# Patient Record
Sex: Female | Born: 1935 | Race: White | Hispanic: Refuse to answer | Marital: Married | Smoking: Never smoker
Health system: Southern US, Community
[De-identification: ages and names within clinical notes are randomized; demographics above are authoritative.]

## PROBLEM LIST (undated history)

## (undated) DIAGNOSIS — M1990 Unspecified osteoarthritis, unspecified site: Secondary | ICD-10-CM

## (undated) DIAGNOSIS — I10 Essential (primary) hypertension: Secondary | ICD-10-CM

## (undated) DIAGNOSIS — E039 Hypothyroidism, unspecified: Secondary | ICD-10-CM

## (undated) DIAGNOSIS — R000 Tachycardia, unspecified: Secondary | ICD-10-CM

## (undated) HISTORY — PX: TONSILLECTOMY: SUR1361

## (undated) HISTORY — PX: JOINT REPLACEMENT: SHX530

## (undated) HISTORY — PX: APPENDECTOMY: SHX54

## (undated) HISTORY — PX: CERVICAL SPINE SURGERY: SHX589

---

## 1992-08-30 HISTORY — PX: TOTAL HIP ARTHROPLASTY: SHX124

## 2002-08-30 HISTORY — PX: TOTAL HIP ARTHROPLASTY: SHX124

## 2002-08-30 HISTORY — PX: VITRECTOMY: SHX106

## 2002-08-30 HISTORY — PX: CATARACT EXTRACTION W/ INTRAOCULAR LENS IMPLANT: SHX1309

## 2008-08-30 HISTORY — PX: TOTAL KNEE ARTHROPLASTY: SHX125

## 2012-08-30 HISTORY — PX: TOTAL SHOULDER ARTHROPLASTY: SHX126

## 2019-09-25 ENCOUNTER — Ambulatory Visit: Payer: Self-pay

## 2019-10-04 ENCOUNTER — Ambulatory Visit: Payer: Medicare Other | Attending: Internal Medicine

## 2019-10-04 DIAGNOSIS — Z23 Encounter for immunization: Secondary | ICD-10-CM

## 2019-10-04 NOTE — [place] Notes (Signed)
   Covid-19 Vaccination Clinic  Name:  Christine Mcintyre    MRN: [id number] DOB: 06/24/1935  10/04/2019  Christine Mcintyre was observed post Covid-19 immunization for 15 minutes without incidence. She was provided with Vaccine Information Sheet and instruction to access the V-Safe system.   Christine Mcintyre was instructed to call 911 with any severe reactions post vaccine: Marland Kitchen Difficulty breathing  . Swelling of your face and throat  . A fast heartbeat  . A bad rash all over your body  . Dizziness and weakness    Immunizations Administered    Name Date Dose VIS Date Route   Pfizer COVID-19 Vaccine 10/04/2019  4:45 PM 0.3 mL 08/10/2019 Intramuscular   Manufacturer: [place]   Lot: [id number]   [place]: [id number]

## 2019-10-16 ENCOUNTER — Ambulatory Visit: Payer: Self-pay

## 2019-10-29 ENCOUNTER — Ambulatory Visit: Payer: Medicare Other | Attending: Internal Medicine

## 2019-10-29 DIAGNOSIS — Z23 Encounter for immunization: Secondary | ICD-10-CM

## 2019-10-29 NOTE — [place] Notes (Signed)
   Covid-19 Vaccination Clinic  Name:  Maimuna Schlegelmilch    MRN: [id number] DOB: 10-10-1934  10/29/2019  Ms. Houlahan was observed post Covid-19 immunization for 15 minutes without incidence. She was provided with Vaccine Information Sheet and instruction to access the V-Safe system.   Ms. Adamek was instructed to call 911 with any severe reactions post vaccine: Marland Kitchen Difficulty breathing  . Swelling of your face and throat  . A fast heartbeat  . A bad rash all over your body  . Dizziness and weakness    Immunizations Administered    Name Date Dose VIS Date Route   Pfizer COVID-19 Vaccine 10/29/2019  2:15 PM 0.3 mL 08/10/2019 Intramuscular   Manufacturer: [place]   Lot: [id number]   [place]: [id number]

## 2019-10-30 ENCOUNTER — Other Ambulatory Visit: Payer: Self-pay | Admitting: Neurosurgery

## 2019-11-05 NOTE — H&P (Signed)
Patient ID:   [id number] Patient: Christine Mcintyre  Date of Birth: 1934-10-04 Visit Type: Office Visit   Date: 09/12/2019 12:15 PM Provider: Marchia Meiers. Vertell Limber MD   This 85 year old female presents for MRI/bone density review.  HISTORY OF PRESENT ILLNESS:  1.  MRI/bone density review  Patient returns to review her bone density results and MRI.  08/13/2019 normal bone density MRI 08/08/2019 on canopy  Patient reports increased frequency and bowel and bladder urge/stress incontinence  Tylenol is taken t.i.d.  (unable to take other pain medication due to blood pressure issues)  The patient has been having problems with bowel and bladder continence and describes urinary and fecal accidents.  This is highly significant on review of the patient's MRI which shows marked progression of degeneration and stenosis at the L4-5 level with critical stenosis at this level.  The thecal sac is barely recognizable at this level and is severely compressed.  There is also moderately severe compression at the L3-4 level.  The patient has scoliosis and spondylolisthesis of L4 and L5.  She currently grades her pain as 8/10 in severity.  Based on these imaging findings and the patient's complaints consistent with a cauda equinus syndrome, I have recommended proceeding with surgical intervention.  We are in a markedly reduced availability of surgery due to the COVID-19 pandemic and our cancelling elective surgeries.  It is my belief that this patient's complaints are worrisome and that she needs to go through surgery sooner rather than later and I therefore am recommending proceeding with surgical intervention.  I do think that the patient needs to have established medical care and a medical clearance prior to surgery         Medical/Surgical/Interim History Reviewed, no change.  Last detailed document date:08/01/2019.     PAST MEDICAL HISTORY, SURGICAL HISTORY, FAMILY HISTORY, SOCIAL HISTORY AND  REVIEW OF SYSTEMS I have reviewed the patient's past medical, surgical, family and social history as well as the comprehensive review of systems as included on the [facility] history form dated 06/18/2019, which I have signed.  Family History:  Reviewed, no changes.  Last detailed document date:08/01/2019.   Social History: Reviewed, no changes. Last detailed document date: 08/01/2019.    MEDICATIONS: (added, continued or stopped this visit) Started Medication Directions Instruction Stopped   amlodipine      calcium 1000 ORAL TABLET      dorzolamide      levothyroxine      losartan 100 mg tablet      magnesium      metoprolol succinate ER 100 mg tablet,extended release 24 hr      timolol maleate        ALLERGIES: Ingredient Reaction Medication Name Comment  TAFLUPROST      Reviewed, no changes.    PHYSICAL EXAM:   Vitals Date Temp F BP Pulse Ht In Wt Lb BMI BSA Pain Score  09/12/2019 97.7 175/87 60 61.5 128 23.79  8/10      IMPRESSION:   Marked spinal stenosis with cauda equina syndrome L4-5 more affected than the L3-4 levels.  PLAN:  I have recommended medical clearance and surgical intervention which consisted L3-4 and L4-5 decompression with fusion without interbody grafting.  We will go ahead with surgery when she is cleared due to the complaints of progressive bowel and bladder dysfunction and the imaging findings which show marked cauda equina compression at the L4-5 level  Orders: Diagnostic Procedures: Assessment Procedure  M54.16  Lumbar Spine- AP/Lat  Instruction(s)/Education: Assessment Instruction  I10 Lifestyle education  Miscellaneous: Assessment   M43.16 LSO Brace   Completed Orders (this encounter) Order Details Reason Side Interpretation Result Initial Treatment Date Region  Lifestyle education Patient will follow up with Primary Care Physician.         Assessment/Plan   # Detail Type Description   1.  Assessment Cauda equina compression (G83.4).       2. Assessment Spondylolisthesis, lumbar region (M43.16).   Plan Orders Family Practice Referral. Clinical information/comments: Patient has recently relocated to this area and needs primary care physician.  She also needs clearance for surgery. [facility]. LSO Brace. Clinical information/comments: Script given to patient.       3. Assessment Osteopenia determined by x-ray (M85.80).       4. Assessment Degenerative lumbar spinal stenosis (M48.061).       5. Assessment Scoliosis (and kyphoscoliosis), idiopathic (M41.20).       6. Assessment Lumbar radiculopathy (M54.16).       7. Assessment Essential (primary) hypertension (I10).         Pain Management Plan Pain Scale: 8/10. Method: Numeric Pain Intensity Scale. Location: back. Onset: 08/01/2019. Duration: varies. Quality: discomforting. Pain management follow-up plan of care: Patient will continue medication management..              Provider:  Marchia Meiers. Vertell Limber MD  09/15/2019 04:00 PM    Dictation edited by: Marchia Meiers. Vertell Limber    CC Providers: Erline Levine MD  [address] [place], [place] [address]               Electronically signed by Marchia Meiers. Vertell Limber MD on 09/15/2019 04:00 PM  Patient ID:   [id number] Patient: Christine Mcintyre  Date of Birth: [birth year] Visit Type: Office Visit   Date: 08/01/2019 09:15 AM Provider: Marchia Meiers. Vertell Limber MD   This 85 year old female presents for Back pain.  HISTORY OF PRESENT ILLNESS:  1.  Back pain  Christine Mcintyre, 85 year old retired female, visits for evaluation of low back, right leg pain with buttock and perineal numbness/tingling.  Patient reports onset of low back pain following work out in October of 2019. Symptoms have been treated conservatively in [place] prior to her move here 2 months ago.  Over the last 3 months, she has noticed stress and urge bowel incontinence coinciding with "waves" of  buttock and perineal numbness.  Physical therapy offered no lasting relief ESI x3 offered no relief  Tylenol taken only as needed  History:  HTN  Surgical history:  Left THR 1994, right THR 2000, right TKR 2010, right shoulder replacement 2014, bilateral carpal tunnel release 2019, C5-6 ACDF/50% corpectomies C5-6  07/13/2018 L-spine MRI uploaded canopy X-ray on canopy   the patient complains of pain at the base of her spine to her pelvic area and tingling into both of her legs which she say get weak.  She notes pain in the coccyx.  She says her right leg pain is better.  She says most of the pain goes to her hamstring.  She feels that things have worsened recently.    I reviewed lumbar  MRI scan from 07/13/2018 which shows L3-4 and L4-5 stenosis with facet arthropathy and scoliosis.    Plain radiographs demonstrate deck stroke convex scoliosis of the lumbar spine with anterolisthesis of L4 and L5 of 6 mm on extension increasing to 9 mm on flexion and decreasing to 7 mm on neutral lateral radiograph.  Cervical  radiographs demonstrate prior anterior cervical decompression and fusion at the C5-6 level with anterior cervical plating without complicating features.    The patient describes that she is very frustrated with her inability to be active and wants to have something done if this is possible.          PAST MEDICAL/SURGICAL HISTORY:   (Detailed)    Disease/disorder Onset Date Management Date Comments    Carpal tunnel release 2019     Knee replacement 2010     Hip replacement 2000     Reverse shoulder replacement      Cervial Corpectomy C5-6 with Fusion    Hypertension         PAST MEDICAL HISTORY, SURGICAL HISTORY, FAMILY HISTORY, SOCIAL HISTORY AND REVIEW OF SYSTEMS I have reviewed the patient's past medical, surgical, family and social history as well as the comprehensive review of systems as included on the [facility] history form dated  06/18/2019, which I have signed.  Family History:  (Detailed) Relationship Family Member Name Deceased Age at Death Condition Onset Age Cause of Death      Family history of Systemic Lupus  N     Social History:  (Detailed) Tobacco use reviewed. Preferred language is [place].   Tobacco use status: Current non-smoker. Smoking status: Former smoker.  SMOKING STATUS Type Smoking Status Usage Per Day Years Used Total Pack Years  Cigarette Former smoker      TOBACCO CESSATION INFORMATION Date Counseled By Order Status Description Code Tobacco Cessation Information  07/23/2019 [place] Tobacco cessation counseling completed   Smoking cessation education       MEDICATIONS: (added, continued or stopped this visit) Started Medication Directions Instruction Stopped   amlodipine      calcium 1000 ORAL TABLET      dorzolamide      levothyroxine      losartan 100 mg tablet      magnesium      metoprolol succinate ER 100 mg tablet,extended release 24 hr      timolol maleate        ALLERGIES: Ingredient Reaction Medication Name Comment  TAFLUPROST      Reviewed, updated.   REVIEW OF SYSTEMS   See scanned patient registration form, dated 06/18/2019, signed and dated on 08/01/2019  Review of Systems Details System Neg/Pos Details  Constitutional Negative Chills, Fatigue, Fever, Malaise, Night sweats, Weight gain and Weight loss.  ENMT Negative Ear drainage, Hearing loss, Nasal drainage, Otalgia, Sinus pressure and Sore throat.  Eyes Negative Eye discharge, Eye pain and Vision changes.  Respiratory Negative Chronic cough, Cough, Dyspnea, Known TB exposure and Wheezing.  Cardio Negative Chest pain, Claudication, Edema and Irregular heartbeat/palpitations.  GI Negative Abdominal pain, Blood in stool, Change in stool pattern, Constipation, Decreased appetite, Diarrhea, Heartburn, Nausea and Vomiting.  GU Negative Dysuria, Hematuria, Polyuria (Genitourinary), Urinary  frequency, Urinary incontinence and Urinary retention.  Endocrine Negative Cold intolerance, Heat intolerance, Polydipsia and Polyphagia.  Neuro Positive Numbness in extremity.  Psych Negative Anxiety, Depression and Insomnia.  Integumentary Negative Brittle hair, Brittle nails, Change in shape/size of mole(s), Hair loss, Hirsutism, Hives, Pruritus, Rash and Skin lesion.  MS Positive Back pain.  Hema/Lymph Negative Easy bleeding, Easy bruising and Lymphadenopathy.  Allergic/Immuno Negative Contact allergy, Environmental allergies, Food allergies and Seasonal allergies.  Reproductive Negative Breast discharge, Breast lumps, Dysmenorrhea, Dyspareunia, History of abnormal PAP smear, Hot flashes, Irregular menses and Vaginal discharge.   PHYSICAL EXAM:   Vitals Date Temp  F BP Pulse Ht In Wt Lb BMI BSA Pain Score  08/01/2019 96.9 190/85 64 61.5 127.8 23.76  5/10    PHYSICAL EXAM Details General Level of Distress: no acute distress Overall Appearance: normal  Head and Face  Right Left  Fundoscopic Exam:  normal normal    Cardiovascular Cardiac: regular rate and rhythm without murmur  Right Left  Carotid Pulses: normal normal  Respiratory Lungs: clear to auscultation  Neurological Orientation: normal Recent and Remote Memory: normal Attention Span and Concentration:   normal Language: normal Fund of Knowledge: normal  Right Left Sensation: normal normal Upper Extremity Coordination: normal normal  Lower Extremity Coordination: normal normal  Musculoskeletal Gait and Station: normal  Right Left Upper Extremity Muscle Strength: normal normal Lower Extremity Muscle Strength: normal normal Upper Extremity Muscle Tone:  normal normal Lower Extremity Muscle Tone: normal normal   Motor Strength Upper and lower extremity motor strength was tested in the clinically pertinent muscles.     Deep Tendon  Reflexes  Right Left Biceps: normal normal Triceps: normal normal Brachioradialis: normal normal Patellar: normal normal Achilles: normal normal  Sensory Sensation was tested at L1 to S1.   Cranial Nerves II. Optic Nerve/Visual Fields: normal III. Oculomotor: normal IV. Trochlear: normal V. Trigeminal: normal VI. Abducens: normal VII. Facial: normal VIII. Acoustic/Vestibular: normal IX. Glossopharyngeal: normal X. Vagus: normal XI. Spinal Accessory: normal XII. Hypoglossal: normal  Motor and other Tests Lhermittes: negative Rhomberg: negative Pronator drift: absent     Right Left Hoffman's: normal normal Clonus: normal normal Babinski: normal normal SLR: negative negative Patrick's Corky Sox): negative negative Toe Walk: normal normal Toe Lift: normal normal Heel Walk: normal normal SI Joint: nontender nontender   Additional Findings:   patient is able to bend within 4 in of the floor with her upper extremities outstretched.    IMPRESSION:     Lumbar spinal stenosis with scoliosis and severe pain with  mobile spondylolisthesis of L4 on L5  PLAN:   follow-up after lumbar MRI scan with bone density testing  Orders: Office Procedures/Services: Assessment Service Comments   Bone density    Diagnostic Procedures: Assessment Procedure  M41.20 Scoliosis- AP/Lat  M43.16 MRI Spine/lumb W/o Contrast  M54.16 Lumbar Spine- AP/Lat/Flex/Ex  Instruction(s)/Education: Assessment Instruction  I10 Lifestyle education   Completed Orders (this encounter) Order Details Reason Side Interpretation Result Initial Treatment Date Region  Lumbar Spine- AP/Lat/Flex/Ex 1 of 2     08/01/2019 All Levels to All Levels  Scoliosis- AP/Lat 2 of 2     08/01/2019 All Levels to All Levels  Lifestyle education Patient will follow up with primary care physician.         Assessment/Plan   # Detail Type Description   1. Assessment Idiopathic scoliosis of lumbar region (M41.26).        2. Assessment Low back pain, unspecified back pain laterality, with sciatica presence unspecified (M54.5).       3. Assessment Spondylolisthesis, lumbar region (M43.16).       4. Assessment Degenerative lumbar spinal stenosis (M48.061).       5. Assessment Lumbar radiculopathy (M54.16).       6. Assessment Scoliosis (and kyphoscoliosis), idiopathic (M41.20).       7. Assessment Essential (primary) hypertension (I10).       8. Other Orders Orders not associated to today's assessments.   Plan Orders Bone density .     Pain Management Plan Pain Scale: 5/10. Method: Numeric Pain Intensity Scale. Onset: 08/01/2019. Duration: varies. Quality: discomforting.  Pain management follow-up plan of care: Patient will continue medication management.              Provider:  Marchia Meiers. Vertell Limber MD  08/02/2019 12:32 PM    Dictation edited by: Marchia Meiers. Vertell Limber    CC Providers: Erline Levine MD  [address] [place], [place] [address]               Electronically signed by Marchia Meiers. Vertell Limber MD on 08/02/2019 12:32 PM

## 2019-11-29 NOTE — [place] Notes (Signed)
[facility] [place], [place] AT [facility] OF [address] [place] [address] Phone: [phone] Fax: [phone]    Your procedure is scheduled on Tuesday, April 6th.  Report to [facility] Main Entrance "A" at 9:40 A.M., and check in at the Admitting office.  Call this number if you have problems the morning of surgery:  [phone]  Call [phone] if you have any questions prior to your surgery date Monday-Friday 8am-4pm   Remember:  Do not eat or drink after midnight the night before your surgery    Take these medicines the morning of surgery with A SIP OF WATER  brimonidine (ALPHAGAN)/eye drops dorzolamide (TRUSOPT)/eye drops levothyroxine (SYNTHROID) metoprolol succinate (TOPROL-XL) timolol (TIMOPTIC)/eye drops  If needed - acetaminophen (TYLENOL)    As of today, STOP taking any Aspirin (unless otherwise instructed by your surgeon) and Aspirin containing products, Aleve, Naproxen, Ibuprofen, Motrin, Advil, Goody's, BC's, all herbal medications, fish oil, and all vitamins.             Do not wear jewelry, make up, or nail polish            Do not wear lotions, powders, perfumes, or deodorant.            Do not shave 48 hours prior to surgery.  Men may shave face and neck.            Do not bring valuables to the hospital.            [facility] is not responsible for any belongings or valuables.  Do NOT Smoke (Tobacco/Vapping) or drink Alcohol 24 hours prior to your procedure If you use a CPAP at night, you may bring all equipment for your overnight stay.   Contacts, glasses, dentures or bridgework may not be worn into surgery.      For patients admitted to the hospital, discharge time will be determined by your treatment team.   Patients discharged the day of surgery will not be allowed to drive home, and someone needs to stay with them for 24 hours.  Special instructions:   [place]- Preparing For  Surgery  Before surgery, you can play an important role. Because skin is not sterile, your skin needs to be as free of germs as possible. You can reduce the number of germs on your skin by washing with CHG (chlorahexidine gluconate) Soap before surgery.  CHG is an antiseptic cleaner which kills germs and bonds with the skin to continue killing germs even after washing.    Oral Hygiene is also important to reduce your risk of infection.  Remember - BRUSH YOUR TEETH THE MORNING OF SURGERY WITH YOUR REGULAR TOOTHPASTE  Please do not use if you have an allergy to CHG or antibacterial soaps. If your skin becomes reddened/irritated stop using the CHG.  Do not shave (including legs and underarms) for at least 48 hours prior to first CHG shower. It is OK to shave your face.  Please follow these instructions carefully.   1. Shower the NIGHT BEFORE SURGERY and the MORNING OF SURGERY with CHG Soap.   2. If you chose to wash your hair, wash your hair first as usual with your normal shampoo.  3. After you shampoo, rinse your hair and body thoroughly to remove the shampoo.  4. Use CHG as you would any other liquid soap. You can apply CHG directly to the skin and wash  gently with a scrungie or a clean washcloth.   5. Apply the CHG Soap to your body ONLY FROM THE NECK DOWN.  Do not use on open wounds or open sores. Avoid contact with your eyes, ears, mouth and genitals (private parts). Wash Face and genitals (private parts)  with your normal soap.   6. Wash thoroughly, paying special attention to the area where your surgery will be performed.  7. Thoroughly rinse your body with warm water from the neck down.  8. DO NOT shower/wash with your normal soap after using and rinsing off the CHG Soap.  9. Pat yourself dry with a CLEAN TOWEL.  10. Wear CLEAN PAJAMAS to bed the night before surgery, wear comfortable clothes the morning of surgery  11. Place CLEAN SHEETS on your bed the night of your first  shower and DO NOT SLEEP WITH PETS.  Day of Surgery: Do not apply any deodorants/lotions.  Please wear clean clothes to the hospital/surgery center.   Remember to brush your teeth WITH YOUR REGULAR TOOTHPASTE.   Please read over the following fact sheets that you were given.

## 2019-11-30 ENCOUNTER — Encounter (HOSPITAL_COMMUNITY): Payer: Self-pay

## 2019-11-30 ENCOUNTER — Other Ambulatory Visit (HOSPITAL_COMMUNITY)
Admission: RE | Admit: 2019-11-30 | Discharge: 2019-11-30 | Disposition: A | Discharge status: Home / Self Care | Payer: Medicare Other | Source: Ambulatory Visit | Attending: Neurosurgery | Admitting: Neurosurgery

## 2019-11-30 ENCOUNTER — Other Ambulatory Visit: Payer: Self-pay

## 2019-11-30 ENCOUNTER — Encounter (HOSPITAL_COMMUNITY)
Admission: RE | Admit: 2019-11-30 | Discharge: 2019-11-30 | Disposition: A | Discharge status: Home / Self Care | Payer: Medicare Other | Source: Ambulatory Visit | Attending: Neurosurgery | Admitting: Neurosurgery

## 2019-11-30 DIAGNOSIS — Z20822 Contact with and (suspected) exposure to covid-19: Secondary | ICD-10-CM | POA: Insufficient documentation

## 2019-11-30 DIAGNOSIS — Z01812 Encounter for preprocedural laboratory examination: Secondary | ICD-10-CM | POA: Insufficient documentation

## 2019-11-30 HISTORY — DX: Essential (primary) hypertension: I10

## 2019-11-30 HISTORY — DX: Tachycardia, unspecified: R00.0

## 2019-11-30 HISTORY — DX: Unspecified osteoarthritis, unspecified site: M19.90

## 2019-11-30 HISTORY — DX: Hypothyroidism, unspecified: E03.9

## 2019-11-30 LAB — SURGICAL PCR SCREEN
MRSA, PCR: NEGATIVE
Staphylococcus aureus: NEGATIVE

## 2019-11-30 LAB — BASIC METABOLIC PANEL
Anion gap: 7 (ref 5–15)
BUN: 18 mg/dL (ref 8–23)
CO2: 28 mmol/L (ref 22–32)
Calcium: 9.4 mg/dL (ref 8.9–10.3)
Chloride: 106 mmol/L (ref 98–111)
Creatinine, Ser: 0.74 mg/dL (ref 0.44–1.00)
GFR calc Af Amer: >60 mL/min (ref >60)
GFR calc non Af Amer: >60 mL/min (ref >60)
Glucose, Bld: 92 mg/dL (ref 70–99)
Potassium: 4.1 mmol/L (ref 3.5–5.1)
Sodium: 141 mmol/L (ref 135–145)

## 2019-11-30 LAB — CBC
HCT: 47.9 % — ABNORMAL HIGH (ref 36.0–46.0)
Hemoglobin: 15.1 g/dL — ABNORMAL HIGH (ref 12.0–15.0)
MCH: 28 pg (ref 26.0–34.0)
MCHC: 31.5 g/dL (ref 30.0–36.0)
MCV: 88.7 fL (ref 80.0–100.0)
Platelets: 232 10*3/uL (ref 150–400)
RBC: 5.4 MIL/uL — ABNORMAL HIGH (ref 3.87–5.11)
RDW: 14.7 % (ref 11.5–15.5)
WBC: 5.1 10*3/uL (ref 4.0–10.5)
nRBC: 0 % (ref 0.0–0.2)

## 2019-11-30 LAB — SARS CORONAVIRUS 2 (TAT 6-24 HRS): SARS Coronavirus 2: NEGATIVE

## 2019-11-30 LAB — ABO/RH: ABO/RH(D): O POS

## 2019-11-30 LAB — TYPE AND SCREEN
ABO/RH(D): O POS
Antibody Screen: NEGATIVE

## 2019-11-30 NOTE — [place] Notes (Addendum)
PCP - Dr. Shon Baton - patient recently relocated Cardiologist - denies  PPM/ICD - denies   Chest x-ray - N/A EKG - per patient "had one last month at [facility]" - Tracing requested Stress Test 07/31/15 (C.E.) ECHO - 08/06/14  (C.[facility].) Cardiac Cath - denies  Sleep Study - denies CPAP - N/A  Blood Thinner Instructions: N/A Aspirin Instructions: N/A  ERAS Protcol - No PRE-SURGERY Ensure or G2-   COVID TEST- Scheduled for today 11/30/2019 after PAT appointment. Patient verbalized understanding of self-quarantine instruction, appointment time and place.  Anesthesia review: YES, cardiac history, medical clearance, EKG tracing requested.  Patient denies shortness of breath, fever, cough and chest pain at PAT appointment  All instructions explained to the patient, with a verbal understanding of the material. Patient agrees to go over the instructions while at home for a better understanding. Patient also instructed to self quarantine after being tested for COVID-19. The opportunity to ask questions was provided.

## 2019-12-03 ENCOUNTER — Encounter (HOSPITAL_COMMUNITY): Payer: Self-pay

## 2019-12-03 NOTE — Progress Notes (Signed)
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    VS: BP (!) 145/85   Pulse 61   Temp 36.7 C (Oral)   Resp 17   Ht 5' 1.5" (1.562 m)   Wt 56.5 kg   SpO2 99%   BMI 23.16 kg/m    PROVIDERS: - PCP is Shon Baton, MD   LABS: Labs reviewed: Acceptable for surgery. (all labs ordered are listed, but only abnormal results are displayed)  Labs Reviewed  CBC - Abnormal; Notable for the following components:      Result Value   RBC 5.40 (*)    Hemoglobin 15.1 (*)    HCT 47.9 (*)    All other components within normal limits  SURGICAL PCR SCREEN  BASIC METABOLIC PANEL  TYPE AND SCREEN  ABO/RH     EKG 10/25/19 (at Dr. [facility]ane [employer] office): sinus bradycardia (57 bom). LA enlargement   CV:  Echo 07/31/15 (at [facility] care everywhere):  CONCLUSIONS: - Exam indication: I35.1 Aortic valve insufficiency (Nonrheumatic) - Left ventricular systolic function is normal. EF = 60  5% (visual est.) Baseline left ventricular diastolic function is consistent with abnormal relaxation (stage 1). - The right ventricle is normal in size. Right ventricular systolic function is normal. - The left atrial cavity is mildly dilated. - Aortic valve sclerosis without stenosis. Mild AI. Compared with the prior CC stress echocardiographic exam performed on 08/06/2014, there has been no significant change.   Stress echo 08/06/14 (at [facility] care everywhere):  CONCLUSIONS: - Exam indication: Hypertension, PVC, palpitations, shortness of breath, abnormal   EKG - The exercise stress echo was negative for ischemia at 91 % of MPHR and good  workload achieved (7.0 METS). - PVC's noted throughout the study. - Normal heart rate recovery and chronotropic response index. - The left ventricle is normal in size. Left ventricular systolic function is normal. EF = 62  5% (2D biplane) - The right ventricle is normal in size. Right ventricular systolic function is  normal. - The left atrial cavity is mildly dilated. - Mild MAC. Mild MR. - Mild-moderate aortic valve sclerosis without stenosis. Mild-moderate AI. - No prior echocardiographic exam available for comparison.    Past Medical History:  Diagnosis Date  . Arthritis    11/30/2019: per patient "both hands and left knee"  . Hypertension   . Tachycardia    11/30/2019: diagnosed about 5-6 years ago, been fine since taking metoprolol" per patient    Past Surgical History:  Procedure Laterality Date  . APPENDECTOMY    . CATARACT EXTRACTION W/ INTRAOCULAR LENS IMPLANT Left 2004  . CERVICAL SPINE SURGERY     11/30/2019: per patient "about 1-2 years ago, fusion surgery c5-c6"  . JOINT REPLACEMENT    . TONSILLECTOMY    . TOTAL HIP ARTHROPLASTY Left 1994  . TOTAL HIP ARTHROPLASTY Right 2004  . TOTAL KNEE ARTHROPLASTY Right 2010  . TOTAL SHOULDER ARTHROPLASTY Right 2014  . VITRECTOMY Left 2004    MEDICATIONS: . acetaminophen (TYLENOL) 650 MG CR tablet  .  amLODipine (NORVASC) 2.5 MG tablet  . Ascorbic Acid (VITAMIN C) 1000 MG tablet  . Biotin 10000 MCG TABS  . brimonidine (ALPHAGAN) 0.2 % ophthalmic solution  . calcium carbonate (OS-CAL - DOSED IN MG OF ELEMENTAL CALCIUM) 1250 (500 Ca) MG tablet  . dorzolamide (TRUSOPT) 2 % ophthalmic solution  . levothyroxine (SYNTHROID) 88 MCG tablet  . losartan (COZAAR) 100 MG tablet  . magnesium oxide (MAG-OX) 400 MG tablet  . metoprolol succinate (TOPROL-XL) 100 MG 24 hr tablet  . timolol (TIMOPTIC) 0.5 % ophthalmic solution   No current  facility-administered medications for this encounter.    If no changes, I anticipate pt can proceed with surgery as scheduled.   Willeen Cass, FNP-BC [facility]/Anesthesiology Phone: [phone] 12/03/2019 12:16 PM

## 2019-12-03 NOTE — H&P (Signed)
Patient ID:   [id number] Patient: Christine Mcintyre  Date of Birth: 24-Mar-1935 Visit Type: Office Visit   Date: 09/12/2019 12:15 PM Provider: Marchia Meiers. Vertell Limber MD   This 85 year old female presents for MRI/bone density review.  HISTORY OF PRESENT ILLNESS: 1.  MRI/bone density review  Patient returns to review her bone density results and MRI.  08/13/2019 normal bone density MRI 08/08/2019 on canopy  Patient reports increased frequency and bowel and bladder urge/stress incontinence  Tylenol is taken t.i.d.  (unable to take other pain medication due to blood pressure issues)  The patient has been having problems with bowel and bladder continence and describes urinary and fecal accidents.  This is highly significant on review of the patient's MRI which shows marked progression of degeneration and stenosis at the L4-5 level with critical stenosis at this level.  The thecal sac is barely recognizable at this level and is severely compressed.  There is also moderately severe compression at the L3-4 level.  The patient has scoliosis and spondylolisthesis of L4 and L5.  She currently grades her pain as 8/10 in severity.  Based on these imaging findings and the patient's complaints consistent with a cauda equinus syndrome, I have recommended proceeding with surgical intervention.  We are in a markedly reduced availability of surgery due to the COVID-19 pandemic and our cancelling elective surgeries.  It is my belief that this patient's complaints are worrisome and that she needs to go through surgery sooner rather than later and I therefore am recommending proceeding with surgical intervention.  I do think that the patient needs to have established medical care and a medical clearance prior to surgery      Medical/Surgical/Interim History Reviewed, no change.  Last detailed document date:08/01/2019.     PAST MEDICAL HISTORY, SURGICAL HISTORY, FAMILY HISTORY, SOCIAL HISTORY AND REVIEW OF  SYSTEMS I have reviewed the patient's past medical, surgical, family and social history as well as the comprehensive review of systems as included on the [facility] history form dated 06/18/2019, which I have signed.  Family History: Reviewed, no changes.  Last detailed document date:08/01/2019.   Social History: Reviewed, no changes. Last detailed document date: 08/01/2019.    MEDICATIONS: (added, continued or stopped this visit) Started Medication Directions Instruction Stopped  amlodipine     calcium 1000 ORAL TABLET     dorzolamide     levothyroxine     losartan 100 mg tablet     magnesium     metoprolol succinate ER 100 mg tablet,extended release 24 hr     timolol maleate       ALLERGIES: Ingredient Reaction Medication Name Comment TAFLUPROST     Reviewed, no changes.    PHYSICAL EXAM:  Vitals Date Temp F BP Pulse Ht In Wt Lb BMI BSA Pain Score 09/12/2019 97.7 175/87 60 61.5 128 23.79  8/10     IMPRESSION:  Marked spinal stenosis with cauda equina syndrome L4-5 more affected than the L3-4 levels.  PLAN: I have recommended medical clearance and surgical intervention which consisted L3-4 and L4-5 decompression with fusion without interbody grafting.  We will go ahead with surgery when she is cleared due to the complaints of progressive bowel and bladder dysfunction and the imaging findings which show marked cauda equina compression at the L4-5 level  Orders: Diagnostic Procedures: Assessment Procedure M54.16 Lumbar Spine- AP/Lat Instruction(s)/Education: Assessment Instruction I10 Lifestyle education Miscellaneous: Assessment  M43.16 LSO Brace  Completed Orders (this encounter) Order Details  Reason Side Interpretation Result Initial Treatment Date Region Lifestyle education Patient will follow up with Primary Care  Physician.        Assessment/Plan  # Detail Type Description  1. Assessment Cauda equina compression (G83.4).     2. Assessment Spondylolisthesis, lumbar region (M43.16).  Plan Orders Family Practice Referral. Clinical information/comments: Patient has recently relocated to this area and needs primary care physician.  She also needs clearance for surgery. [facility]. LSO Brace. Clinical information/comments: Script given to patient.     3. Assessment Osteopenia determined by x-ray (M85.80).     4. Assessment Degenerative lumbar spinal stenosis (M48.061).     5. Assessment Scoliosis (and kyphoscoliosis), idiopathic (M41.20).     6. Assessment Lumbar radiculopathy (M54.16).     7. Assessment Essential (primary) hypertension (I10).       Pain Management Plan Pain Scale: 8/10. Method: Numeric Pain Intensity Scale. Location: back. Onset: 08/01/2019. Duration: varies. Quality: discomforting. Pain management follow-up plan of care: Patient will continue medication management..              Provider:  Marchia Meiers. Vertell Limber MD  09/15/2019 04:00 PM    Dictation edited by: Marchia Meiers. Vertell Limber    CC Providers: Erline Levine MD  [address] [place], [place] [address]               Electronically signed by Marchia Meiers. Vertell Limber MD on 09/15/2019 04:00 PM

## 2019-12-03 NOTE — Anesthesia Preprocedure Evaluation (Addendum)
Anesthesia Evaluation  Patient identified by MRN, date of birth, ID band Patient awake    Reviewed: Allergy & Precautions, H&P , NPO status , Patient's Chart, lab work & pertinent test results, reviewed documented beta blocker date and time   Airway Mallampati: II  TM Distance: >3 FB Neck ROM: Full    Dental no notable dental hx. (+) Teeth Intact, Dental Advisory Given   Pulmonary neg pulmonary ROS,    Pulmonary exam normal breath sounds clear to auscultation       Cardiovascular hypertension, Pt. on medications and Pt. on home beta blockers  Rhythm:Regular Rate:Normal     Neuro/Psych negative neurological ROS  negative psych ROS   GI/Hepatic negative GI ROS, Neg liver ROS,   Endo/Other  Hypothyroidism   Renal/GU negative Renal ROS  negative genitourinary   Musculoskeletal  (+) Arthritis , Osteoarthritis,    Abdominal   Peds  Hematology negative hematology ROS (+)   Anesthesia Other Findings   Reproductive/Obstetrics negative OB ROS                          Anesthesia Physical Anesthesia Plan  ASA: II  Anesthesia Plan: General   Post-op Pain Management:    Induction: Intravenous  PONV Risk Score and Plan: 4 or greater and Ondansetron, Dexamethasone and Treatment may vary due to age or medical condition  Airway Management Planned: Oral ETT  Additional Equipment:   Intra-op Plan:   Post-operative Plan: Extubation in OR  Informed Consent: I have reviewed the patients History and Physical, chart, labs and discussed the procedure including the risks, benefits and alternatives for the proposed anesthesia with the patient or authorized representative who has indicated his/her understanding and acceptance.     Dental advisory given  Plan Discussed with: CRNA  Anesthesia Plan Comments: (See APP note by Durel Salts, FNP)       Anesthesia Quick Evaluation

## 2019-12-04 ENCOUNTER — Other Ambulatory Visit: Payer: Self-pay

## 2019-12-04 ENCOUNTER — Observation Stay (HOSPITAL_COMMUNITY)
Admission: RE | Admit: 2019-12-04 | Discharge: 2019-12-05 | Disposition: A | Discharge status: Home / Self Care | Payer: Medicare Other | Attending: Neurosurgery | Admitting: Neurosurgery

## 2019-12-04 ENCOUNTER — Inpatient Hospital Stay (HOSPITAL_COMMUNITY): Payer: Medicare Other | Admitting: Certified Registered Nurse Anesthetist

## 2019-12-04 ENCOUNTER — Encounter (HOSPITAL_COMMUNITY)
Admission: RE | Disposition: A | Discharge status: Home / Self Care | Payer: Self-pay | Source: Home / Self Care | Attending: Neurosurgery

## 2019-12-04 ENCOUNTER — Encounter (HOSPITAL_COMMUNITY): Payer: Self-pay | Admitting: Neurosurgery

## 2019-12-04 ENCOUNTER — Inpatient Hospital Stay (HOSPITAL_COMMUNITY): Payer: Medicare Other

## 2019-12-04 ENCOUNTER — Inpatient Hospital Stay (HOSPITAL_COMMUNITY): Payer: Medicare Other | Admitting: Physician Assistant

## 2019-12-04 DIAGNOSIS — M8580 Other specified disorders of bone density and structure, unspecified site: Secondary | ICD-10-CM | POA: Insufficient documentation

## 2019-12-04 DIAGNOSIS — Z87891 Personal history of nicotine dependence: Secondary | ICD-10-CM | POA: Insufficient documentation

## 2019-12-04 DIAGNOSIS — Z79890 Hormone replacement therapy (postmenopausal): Secondary | ICD-10-CM | POA: Insufficient documentation

## 2019-12-04 DIAGNOSIS — Z96611 Presence of right artificial shoulder joint: Secondary | ICD-10-CM | POA: Diagnosis not present

## 2019-12-04 DIAGNOSIS — M1990 Unspecified osteoarthritis, unspecified site: Secondary | ICD-10-CM | POA: Diagnosis not present

## 2019-12-04 DIAGNOSIS — Z96651 Presence of right artificial knee joint: Secondary | ICD-10-CM | POA: Insufficient documentation

## 2019-12-04 DIAGNOSIS — M48061 Spinal stenosis, lumbar region without neurogenic claudication: Secondary | ICD-10-CM | POA: Insufficient documentation

## 2019-12-04 DIAGNOSIS — M4316 Spondylolisthesis, lumbar region: Principal | ICD-10-CM | POA: Insufficient documentation

## 2019-12-04 DIAGNOSIS — Z888 Allergy status to other drugs, medicaments and biological substances status: Secondary | ICD-10-CM | POA: Diagnosis not present

## 2019-12-04 DIAGNOSIS — M5116 Intervertebral disc disorders with radiculopathy, lumbar region: Secondary | ICD-10-CM | POA: Diagnosis not present

## 2019-12-04 DIAGNOSIS — I10 Essential (primary) hypertension: Secondary | ICD-10-CM | POA: Diagnosis not present

## 2019-12-04 DIAGNOSIS — E039 Hypothyroidism, unspecified: Secondary | ICD-10-CM | POA: Insufficient documentation

## 2019-12-04 DIAGNOSIS — M5126 Other intervertebral disc displacement, lumbar region: Secondary | ICD-10-CM | POA: Diagnosis not present

## 2019-12-04 DIAGNOSIS — G834 Cauda equina syndrome: Secondary | ICD-10-CM | POA: Diagnosis present

## 2019-12-04 DIAGNOSIS — R531 Weakness: Secondary | ICD-10-CM | POA: Diagnosis not present

## 2019-12-04 DIAGNOSIS — M419 Scoliosis, unspecified: Secondary | ICD-10-CM | POA: Diagnosis not present

## 2019-12-04 DIAGNOSIS — N393 Stress incontinence (female) (male): Secondary | ICD-10-CM | POA: Diagnosis not present

## 2019-12-04 DIAGNOSIS — R152 Fecal urgency: Secondary | ICD-10-CM | POA: Diagnosis not present

## 2019-12-04 DIAGNOSIS — Z79899 Other long term (current) drug therapy: Secondary | ICD-10-CM | POA: Insufficient documentation

## 2019-12-04 DIAGNOSIS — Z96643 Presence of artificial hip joint, bilateral: Secondary | ICD-10-CM | POA: Insufficient documentation

## 2019-12-04 DIAGNOSIS — Z419 Encounter for procedure for purposes other than remedying health state, unspecified: Secondary | ICD-10-CM

## 2019-12-04 SURGERY — POSTERIOR LUMBAR FUSION 2 LEVEL
Anesthesia: General | Site: Back

## 2019-12-04 MED ORDER — CALCIUM CARBONATE 1250 (500 CA) MG PO TABS
1.0000 | ORAL_TABLET | Freq: Every day | ORAL | Status: DC
  Administered 2019-12-05: 08:00:00 500 mg via ORAL
  Filled 2019-12-04: qty 1

## 2019-12-04 MED ORDER — CELECOXIB 200 MG PO CAPS
200.0000 mg | ORAL_CAPSULE | Freq: Once | ORAL | Status: AC
  Administered 2019-12-04: 10:00:00 200 mg via ORAL
  Filled 2019-12-04: qty 1

## 2019-12-04 MED ORDER — MENTHOL 3 MG MT LOZG
1.0000 | LOZENGE | OROMUCOSAL | Status: DC | PRN
  Filled 2019-12-04: qty 9

## 2019-12-04 MED ORDER — BIOTIN 10000 MCG PO TABS: 10000.0000 ug | ORAL_TABLET | Freq: Every day | ORAL | Status: DC

## 2019-12-04 MED ORDER — METHOCARBAMOL 1000 MG/10ML IJ SOLN
500.0000 mg | Freq: Four times a day (QID) | INTRAVENOUS | Status: DC | PRN
  Filled 2019-12-04: qty 5

## 2019-12-04 MED ORDER — FENTANYL CITRATE (PF) 100 MCG/2ML IJ SOLN
25.0000 ug | INTRAMUSCULAR | Status: DC | PRN
  Administered 2019-12-04 (×4): 25 ug via INTRAVENOUS

## 2019-12-04 MED ORDER — ACETAMINOPHEN ER 650 MG PO TBCR: 1300.0000 mg | EXTENDED_RELEASE_TABLET | Freq: Three times a day (TID) | ORAL | Status: DC | PRN

## 2019-12-04 MED ORDER — ONDANSETRON HCL 4 MG/2ML IJ SOLN: 4.0000 mg | Freq: Four times a day (QID) | INTRAMUSCULAR | Status: DC | PRN

## 2019-12-04 MED ORDER — FLEET ENEMA 7-19 GM/118ML RE ENEM: 1.0000 | ENEMA | Freq: Once | RECTAL | Status: DC | PRN

## 2019-12-04 MED ORDER — SODIUM CHLORIDE 0.9 % IV SOLN
250.0000 mL | INTRAVENOUS | Status: DC
  Administered 2019-12-04: 19:00:00 250 mL via INTRAVENOUS

## 2019-12-04 MED ORDER — LOSARTAN POTASSIUM 50 MG PO TABS: 100.0000 mg | ORAL_TABLET | Freq: Every day | ORAL | Status: DC

## 2019-12-04 MED ORDER — AMLODIPINE BESYLATE 2.5 MG PO TABS
2.5000 mg | ORAL_TABLET | Freq: Every day | ORAL | Status: DC
  Administered 2019-12-04: 2.5 mg via ORAL
  Filled 2019-12-04 (×2): qty 1

## 2019-12-04 MED ORDER — MAGNESIUM OXIDE 400 (241.3 MG) MG PO TABS
400.0000 mg | ORAL_TABLET | Freq: Every day | ORAL | Status: DC
  Administered 2019-12-05: 11:00:00 400 mg via ORAL
  Filled 2019-12-04: qty 1

## 2019-12-04 MED ORDER — BUPIVACAINE HCL (PF) 0.5 % IJ SOLN
INTRAMUSCULAR | Status: DC | PRN
  Administered 2019-12-04: 5 mL

## 2019-12-04 MED ORDER — LACTATED RINGERS IV SOLN: INTRAVENOUS | Status: DC

## 2019-12-04 MED ORDER — PROPOFOL 10 MG/ML IV BOLUS
INTRAVENOUS | Status: DC | PRN
  Administered 2019-12-04: 30 mg via INTRAVENOUS
  Administered 2019-12-04: 70 mg via INTRAVENOUS

## 2019-12-04 MED ORDER — CEFAZOLIN SODIUM-DEXTROSE 2-4 GM/100ML-% IV SOLN
2.0000 g | INTRAVENOUS | Status: AC
  Administered 2019-12-04: 2 g via INTRAVENOUS

## 2019-12-04 MED ORDER — ZOLPIDEM TARTRATE 5 MG PO TABS: 5.0000 mg | ORAL_TABLET | Freq: Every evening | ORAL | Status: DC | PRN

## 2019-12-04 MED ORDER — BISACODYL 10 MG RE SUPP: 10.0000 mg | Freq: Every day | RECTAL | Status: DC | PRN

## 2019-12-04 MED ORDER — PANTOPRAZOLE SODIUM 40 MG PO TBEC
40.0000 mg | DELAYED_RELEASE_TABLET | Freq: Every day | ORAL | Status: DC
  Administered 2019-12-04 – 2019-12-05 (×2): 40 mg via ORAL
  Filled 2019-12-04 (×2): qty 1

## 2019-12-04 MED ORDER — CEFAZOLIN SODIUM-DEXTROSE 2-4 GM/100ML-% IV SOLN
2.0000 g | Freq: Three times a day (TID) | INTRAVENOUS | Status: AC
  Administered 2019-12-04 – 2019-12-05 (×2): 2 g via INTRAVENOUS
  Filled 2019-12-04 (×2): qty 100

## 2019-12-04 MED ORDER — METHOCARBAMOL 500 MG PO TABS
500.0000 mg | ORAL_TABLET | Freq: Four times a day (QID) | ORAL | Status: DC | PRN
  Administered 2019-12-04 – 2019-12-05 (×3): 500 mg via ORAL
  Filled 2019-12-04 (×3): qty 1

## 2019-12-04 MED ORDER — PANTOPRAZOLE SODIUM 40 MG IV SOLR: 40.0000 mg | Freq: Every day | INTRAVENOUS | Status: DC

## 2019-12-04 MED ORDER — SODIUM CHLORIDE 0.9% FLUSH: 3.0000 mL | INTRAVENOUS | Status: DC | PRN

## 2019-12-04 MED ORDER — TIMOLOL MALEATE 0.5 % OP SOLN
1.0000 [drp] | Freq: Two times a day (BID) | OPHTHALMIC | Status: DC
  Administered 2019-12-04 – 2019-12-05 (×2): 1 [drp] via OPHTHALMIC
  Filled 2019-12-04: qty 5

## 2019-12-04 MED ORDER — PHENOL 1.4 % MT LIQD: 1.0000 | OROMUCOSAL | Status: DC | PRN

## 2019-12-04 MED ORDER — BUPIVACAINE LIPOSOME 1.3 % IJ SUSP
20.0000 mL | Freq: Once | INTRAMUSCULAR | Status: DC
  Filled 2019-12-04: qty 20

## 2019-12-04 MED ORDER — ACETAMINOPHEN 325 MG PO TABS
650.0000 mg | ORAL_TABLET | ORAL | Status: DC | PRN
  Administered 2019-12-05: 650 mg via ORAL
  Filled 2019-12-04: qty 2

## 2019-12-04 MED ORDER — FENTANYL CITRATE (PF) 250 MCG/5ML IJ SOLN
INTRAMUSCULAR | Status: AC
  Filled 2019-12-04: qty 5

## 2019-12-04 MED ORDER — BUPIVACAINE LIPOSOME 1.3 % IJ SUSP
INTRAMUSCULAR | Status: DC | PRN
  Administered 2019-12-04: 20 mL

## 2019-12-04 MED ORDER — DEXAMETHASONE SODIUM PHOSPHATE 10 MG/ML IJ SOLN
INTRAMUSCULAR | Status: AC
  Filled 2019-12-04: qty 1

## 2019-12-04 MED ORDER — OXYCODONE HCL 5 MG PO TABS: 5.0000 mg | ORAL_TABLET | ORAL | Status: DC | PRN

## 2019-12-04 MED ORDER — SODIUM CHLORIDE 0.9% FLUSH: 3.0000 mL | Freq: Two times a day (BID) | INTRAVENOUS | Status: DC

## 2019-12-04 MED ORDER — CEFAZOLIN SODIUM-DEXTROSE 2-4 GM/100ML-% IV SOLN
INTRAVENOUS | Status: AC
  Filled 2019-12-04: qty 100

## 2019-12-04 MED ORDER — LIDOCAINE 2% (20 MG/ML) 5 ML SYRINGE
INTRAMUSCULAR | Status: AC
  Filled 2019-12-04: qty 5

## 2019-12-04 MED ORDER — ACETAMINOPHEN 10 MG/ML IV SOLN
INTRAVENOUS | Status: DC | PRN
  Administered 2019-12-04: 1000 mg via INTRAVENOUS

## 2019-12-04 MED ORDER — LEVOTHYROXINE SODIUM 88 MCG PO TABS
88.0000 ug | ORAL_TABLET | Freq: Every day | ORAL | Status: DC
  Administered 2019-12-05: 88 ug via ORAL
  Filled 2019-12-04: qty 1

## 2019-12-04 MED ORDER — METOPROLOL SUCCINATE ER 100 MG PO TB24
100.0000 mg | ORAL_TABLET | Freq: Every day | ORAL | Status: DC
  Administered 2019-12-04: 100 mg via ORAL
  Filled 2019-12-04: qty 1

## 2019-12-04 MED ORDER — ASCORBIC ACID 500 MG PO TABS: 1000.0000 mg | ORAL_TABLET | Freq: Every day | ORAL | Status: DC

## 2019-12-04 MED ORDER — LIDOCAINE 2% (20 MG/ML) 5 ML SYRINGE
INTRAMUSCULAR | Status: DC | PRN
  Administered 2019-12-04: 70 mg via INTRAVENOUS

## 2019-12-04 MED ORDER — KCL IN DEXTROSE-NACL 20-5-0.45 MEQ/L-%-% IV SOLN: INTRAVENOUS | Status: DC

## 2019-12-04 MED ORDER — DEXAMETHASONE SODIUM PHOSPHATE 10 MG/ML IJ SOLN
INTRAMUSCULAR | Status: DC | PRN
  Administered 2019-12-04: 4 mg via INTRAVENOUS

## 2019-12-04 MED ORDER — BRIMONIDINE TARTRATE 0.2 % OP SOLN
1.0000 [drp] | Freq: Three times a day (TID) | OPHTHALMIC | Status: DC
  Administered 2019-12-04 – 2019-12-05 (×2): 1 [drp] via OPHTHALMIC
  Filled 2019-12-04: qty 5

## 2019-12-04 MED ORDER — BUPIVACAINE HCL (PF) 0.5 % IJ SOLN
INTRAMUSCULAR | Status: AC
  Filled 2019-12-04: qty 30

## 2019-12-04 MED ORDER — PROPOFOL 10 MG/ML IV BOLUS
INTRAVENOUS | Status: AC
  Filled 2019-12-04: qty 20

## 2019-12-04 MED ORDER — THROMBIN 5000 UNITS EX SOLR
CUTANEOUS | Status: AC
  Filled 2019-12-04: qty 5000

## 2019-12-04 MED ORDER — THROMBIN 20000 UNITS EX SOLR
CUTANEOUS | Status: AC
  Filled 2019-12-04: qty 20000

## 2019-12-04 MED ORDER — SUGAMMADEX SODIUM 200 MG/2ML IV SOLN
INTRAVENOUS | Status: DC | PRN
  Administered 2019-12-04: 200 mg via INTRAVENOUS

## 2019-12-04 MED ORDER — HYDROMORPHONE HCL 1 MG/ML IJ SOLN
0.5000 mg | INTRAMUSCULAR | Status: DC | PRN
  Administered 2019-12-04: 0.5 mg via INTRAVENOUS
  Filled 2019-12-04: qty 0.5

## 2019-12-04 MED ORDER — DOCUSATE SODIUM 100 MG PO CAPS
100.0000 mg | ORAL_CAPSULE | Freq: Two times a day (BID) | ORAL | Status: DC
  Administered 2019-12-04 – 2019-12-05 (×2): 100 mg via ORAL
  Filled 2019-12-04 (×2): qty 1

## 2019-12-04 MED ORDER — CHLORHEXIDINE GLUCONATE CLOTH 2 % EX PADS: 6.0000 | MEDICATED_PAD | Freq: Once | CUTANEOUS | Status: DC

## 2019-12-04 MED ORDER — PHENYLEPHRINE HCL-NACL 10-0.9 MG/250ML-% IV SOLN
INTRAVENOUS | Status: DC | PRN
  Administered 2019-12-04: 40 ug/min via INTRAVENOUS

## 2019-12-04 MED ORDER — DORZOLAMIDE HCL 2 % OP SOLN
1.0000 [drp] | Freq: Three times a day (TID) | OPHTHALMIC | Status: DC
  Administered 2019-12-04 – 2019-12-05 (×2): 1 [drp] via OPHTHALMIC
  Filled 2019-12-04: qty 10

## 2019-12-04 MED ORDER — LIDOCAINE-EPINEPHRINE 1 %-1:100000 IJ SOLN
INTRAMUSCULAR | Status: DC | PRN
  Administered 2019-12-04: 5 mL

## 2019-12-04 MED ORDER — LIDOCAINE-EPINEPHRINE 1 %-1:100000 IJ SOLN
INTRAMUSCULAR | Status: AC
  Filled 2019-12-04: qty 1

## 2019-12-04 MED ORDER — ONDANSETRON HCL 4 MG/2ML IJ SOLN
INTRAMUSCULAR | Status: DC | PRN
  Administered 2019-12-04: 4 mg via INTRAVENOUS

## 2019-12-04 MED ORDER — POLYETHYLENE GLYCOL 3350 17 G PO PACK: 17.0000 g | PACK | Freq: Every day | ORAL | Status: DC | PRN

## 2019-12-04 MED ORDER — ROCURONIUM BROMIDE 10 MG/ML (PF) SYRINGE
PREFILLED_SYRINGE | INTRAVENOUS | Status: AC
  Filled 2019-12-04: qty 10

## 2019-12-04 MED ORDER — THROMBIN 5000 UNITS EX SOLR
OROMUCOSAL | Status: DC | PRN
  Administered 2019-12-04: 5 mL via TOPICAL

## 2019-12-04 MED ORDER — 0.9 % SODIUM CHLORIDE (POUR BTL) OPTIME
TOPICAL | Status: DC | PRN
  Administered 2019-12-04: 1000 mL

## 2019-12-04 MED ORDER — ALUM & MAG HYDROXIDE-SIMETH 200-200-20 MG/5ML PO SUSP: 30.0000 mL | Freq: Four times a day (QID) | ORAL | Status: DC | PRN

## 2019-12-04 MED ORDER — HYDROCODONE-ACETAMINOPHEN 5-325 MG PO TABS
2.0000 | ORAL_TABLET | ORAL | Status: DC | PRN
  Administered 2019-12-04 – 2019-12-05 (×3): 2 via ORAL
  Filled 2019-12-04 (×3): qty 2

## 2019-12-04 MED ORDER — FENTANYL CITRATE (PF) 100 MCG/2ML IJ SOLN
INTRAMUSCULAR | Status: AC
  Filled 2019-12-04: qty 2

## 2019-12-04 MED ORDER — ACETAMINOPHEN 650 MG RE SUPP: 650.0000 mg | RECTAL | Status: DC | PRN

## 2019-12-04 MED ORDER — THROMBIN 20000 UNITS EX SOLR
CUTANEOUS | Status: DC | PRN
  Administered 2019-12-04: 20 mL via TOPICAL

## 2019-12-04 MED ORDER — ONDANSETRON HCL 4 MG/2ML IJ SOLN
INTRAMUSCULAR | Status: AC
  Filled 2019-12-04: qty 2

## 2019-12-04 MED ORDER — ONDANSETRON HCL 4 MG PO TABS: 4.0000 mg | ORAL_TABLET | Freq: Four times a day (QID) | ORAL | Status: DC | PRN

## 2019-12-04 MED ORDER — ROCURONIUM BROMIDE 10 MG/ML (PF) SYRINGE
PREFILLED_SYRINGE | INTRAVENOUS | Status: DC | PRN
  Administered 2019-12-04: 20 mg via INTRAVENOUS
  Administered 2019-12-04: 30 mg via INTRAVENOUS
  Administered 2019-12-04: 50 mg via INTRAVENOUS

## 2019-12-04 MED ORDER — ACETAMINOPHEN 500 MG PO TABS
1000.0000 mg | ORAL_TABLET | Freq: Once | ORAL | Status: DC
  Filled 2019-12-04: qty 2

## 2019-12-04 MED ORDER — FENTANYL CITRATE (PF) 250 MCG/5ML IJ SOLN
INTRAMUSCULAR | Status: DC | PRN
  Administered 2019-12-04: 50 ug via INTRAVENOUS
  Administered 2019-12-04: 25 ug via INTRAVENOUS
  Administered 2019-12-04: 50 ug via INTRAVENOUS

## 2019-12-04 SURGICAL SUPPLY — 71 items
BASKET BONE COLLECTION (BASKET) ×3 IMPLANT
BLADE CLIPPER SURG (BLADE) IMPLANT
BUR MATCHSTICK NEURO 3.0 LAGG (BURR) ×3 IMPLANT
BUR PRECISION FLUTE 5.0 (BURR) ×3 IMPLANT
CANISTER SUCT 3000ML PPV (MISCELLANEOUS) ×3 IMPLANT
CARTRIDGE OIL MAESTRO DRILL (MISCELLANEOUS) ×1 IMPLANT
CNTNR URN SCR LID CUP LEK RST (MISCELLANEOUS) ×1 IMPLANT
CONT SPEC 4OZ STRL OR WHT (MISCELLANEOUS) ×4
COVER BACK TABLE 60X90IN (DRAPES) ×3 IMPLANT
COVER WAND RF STERILE (DRAPES) ×1 IMPLANT
DECANTER SPIKE VIAL GLASS SM (MISCELLANEOUS) ×3 IMPLANT
DERMABOND ADVANCED (GAUZE/BANDAGES/DRESSINGS) ×2
DERMABOND ADVANCED .7 DNX12 (GAUZE/BANDAGES/DRESSINGS) ×1 IMPLANT
DIFFUSER DRILL AIR PNEUMATIC (MISCELLANEOUS) ×3 IMPLANT
DRAPE C-ARM 42X72 X-RAY (DRAPES) ×3 IMPLANT
DRAPE C-ARMOR (DRAPES) ×3 IMPLANT
DRAPE LAPAROTOMY 100X72X124 (DRAPES) ×3 IMPLANT
DRAPE SURG 17X23 STRL (DRAPES) ×3 IMPLANT
DRSG OPSITE POSTOP 4X6 (GAUZE/BANDAGES/DRESSINGS) ×2 IMPLANT
DURAPREP 26ML APPLICATOR (WOUND CARE) ×3 IMPLANT
ELECT REM PT RETURN 9FT ADLT (ELECTROSURGICAL) ×3
ELECTRODE REM PT RTRN 9FT ADLT (ELECTROSURGICAL) ×1 IMPLANT
EVACUATOR 1/8 PVC DRAIN (DRAIN) IMPLANT
GAUZE 4X4 16PLY RFD (DISPOSABLE) IMPLANT
GAUZE SPONGE 4X4 12PLY STRL (GAUZE/BANDAGES/DRESSINGS) ×3 IMPLANT
GLOVE BIO SURGEON STRL SZ8 (GLOVE) ×6 IMPLANT
GLOVE BIOGEL PI IND STRL 8 (GLOVE) ×2 IMPLANT
GLOVE BIOGEL PI IND STRL 8.5 (GLOVE) ×2 IMPLANT
GLOVE BIOGEL PI INDICATOR 8 (GLOVE) ×4
GLOVE BIOGEL PI INDICATOR 8.5 (GLOVE) ×4
GLOVE ECLIPSE 8.0 STRL XLNG CF (GLOVE) ×6 IMPLANT
GLOVE EXAM NITRILE XL STR (GLOVE) IMPLANT
GOWN STRL REUS W/ TWL LRG LVL3 (GOWN DISPOSABLE) IMPLANT
GOWN STRL REUS W/ TWL XL LVL3 (GOWN DISPOSABLE) ×3 IMPLANT
GOWN STRL REUS W/TWL 2XL LVL3 (GOWN DISPOSABLE) IMPLANT
GOWN STRL REUS W/TWL LRG LVL3 (GOWN DISPOSABLE)
GOWN STRL REUS W/TWL XL LVL3 (GOWN DISPOSABLE) ×6
HEMOSTAT POWDER KIT SURGIFOAM (HEMOSTASIS) ×3 IMPLANT
KIT BASIN OR (CUSTOM PROCEDURE TRAY) ×3 IMPLANT
KIT INFUSE SMALL (Orthopedic Implant) ×2 IMPLANT
KIT POSITION SURG JACKSON T1 (MISCELLANEOUS) ×3 IMPLANT
KIT TURNOVER KIT B (KITS) ×3 IMPLANT
MILL MEDIUM DISP (BLADE) ×2 IMPLANT
NDL HYPO 25X1 1.5 SAFETY (NEEDLE) ×1 IMPLANT
NDL SPNL 18GX3.5 QUINCKE PK (NEEDLE) IMPLANT
NEEDLE HYPO 25X1 1.5 SAFETY (NEEDLE) ×3 IMPLANT
NEEDLE SPNL 18GX3.5 QUINCKE PK (NEEDLE) IMPLANT
NS IRRIG 1000ML POUR BTL (IV SOLUTION) ×3 IMPLANT
OIL CARTRIDGE MAESTRO DRILL (MISCELLANEOUS) ×3
PACK LAMINECTOMY NEURO (CUSTOM PROCEDURE TRAY) ×3 IMPLANT
PAD ARMBOARD 7.5X6 YLW CONV (MISCELLANEOUS) ×9 IMPLANT
PATTIES SURGICAL .5 X.5 (GAUZE/BANDAGES/DRESSINGS) IMPLANT
PATTIES SURGICAL .5 X1 (DISPOSABLE) IMPLANT
PATTIES SURGICAL 1X1 (DISPOSABLE) IMPLANT
ROD RELINE LOROTIC TI 5.5X55MM (Rod) ×2 IMPLANT
ROD RELINE-O LORDOTIC 5.5X60MM (Rod) ×2 IMPLANT
SCREW LOCK RELINE 5.5 TULIP (Screw) ×12 IMPLANT
SCREW RELINE-O POLY 5.5X45MM (Screw) ×4 IMPLANT
SCREW RELINE-O POLY 6.5X40 (Screw) ×8 IMPLANT
SPONGE LAP 4X18 RFD (DISPOSABLE) IMPLANT
SPONGE SURGIFOAM ABS GEL 100 (HEMOSTASIS) IMPLANT
STAPLER SKIN PROX WIDE 3.9 (STAPLE) IMPLANT
SUT VIC AB 1 CT1 18XBRD ANBCTR (SUTURE) ×2 IMPLANT
SUT VIC AB 1 CT1 8-18 (SUTURE) ×2
SUT VIC AB 2-0 CT1 18 (SUTURE) ×6 IMPLANT
SUT VIC AB 3-0 SH 8-18 (SUTURE) ×6 IMPLANT
SYR 5ML LL (SYRINGE) IMPLANT
TOWEL GREEN STERILE (TOWEL DISPOSABLE) ×3 IMPLANT
TOWEL GREEN STERILE FF (TOWEL DISPOSABLE) ×3 IMPLANT
TRAY FOLEY MTR SLVR 16FR STAT (SET/KITS/TRAYS/PACK) ×3 IMPLANT
WATER STERILE IRR 1000ML POUR (IV SOLUTION) ×3 IMPLANT

## 2019-12-04 NOTE — Brief Op Note (Signed)
12/04/2019  3:07 PM  PATIENT:  Christine Mcintyre  85 y.o. female  PRE-OPERATIVE DIAGNOSIS:  Spondylolisthesis, Lumbar region, critical spinal stenosis L45 with cauda equina syndrome, scoliosis, lumbago, radiculopathy L 34 and L 45 levels  POST-OPERATIVE DIAGNOSIS:   Spondylolisthesis, Lumbar region, critical spinal stenosis L45 with cauda equina syndrome, scoliosis, lumbago, radiculopathy L 34 and L 45 levels  PROCEDURE:  Procedure(s) with comments: Lumbar Three-Four Lumbar Four-Five Decompression/fusion (N/A) - No interbody with pedicle screw fixation and posterolateral arthrodesis  SURGEON:  Surgeon(s) and Role:    Erline Levine, MD - Primary    * Vallarie Mare, MD - Assisting  PHYSICIAN ASSISTANT:   ASSISTANTS: [place], RN   ANESTHESIA:   general  EBL:  300 mL   BLOOD ADMINISTERED:none  DRAINS: none   LOCAL MEDICATIONS USED:  MARCAINE    and LIDOCAINE   SPECIMEN:  No Specimen  DISPOSITION OF SPECIMEN:  N/A  COUNTS:  YES  TOURNIQUET:  * No tourniquets in log *  DICTATION: Patient is 85 year old woman with  HNP, spondylosis, scoliosis, stenosis, DDD, cauda equina syndrome, radiculopathy L 34, L 45 levels. She has a severe bilateral leg pain and weakness with loss of bowel/bladder control. It was elected to take her to surgery for decompression and fusion at L 34, L 45  levels.    Procedure: Patient was placed in a prone position on the [place] table after smooth and uncomplicated induction of general endotracheal anesthesia. Her low back was prepped and draped in usual sterile fashion with betadine scrub and DuraPrep after preoperative localization with C arm with LessRay. Area of incision was infiltrated with local lidocaine. Incision was made to the lumbodorsal fascia was incised and exposure was performed of the L 34, L 45 spinous processes laminae facet joint and transverse processes. Intraoperative x-ray was obtained which confirmed correct orientation. A total  laminectomy of L 3 and L 4 was performed with disarticulation of the facet joints at this level and thorough decompression was performed of both L 3, L 4 and L 5 nerve roots along with the common dural tube. There was densely adherent spondylytic material compressing the thecal sac and both L4 and L5 nerve roots.  A small durotomy was made on the left at the level of the L 4 pedicle along the lateral thecal sac.  Arachnoid was visible, but there was no leakage of CSF, so I protected the site with gelfoam.  Decompression was greater than would be typically performed for simple interbody fusion. Elected to not place interbody cages due to the patient's poor bone quality. We The posterolateral region was extensively decorticated and pedicle probes were placed at L 3, L 4 and L 5 bilaterally. Intraoperative fluoroscopy confirmed correct orientation in the AP and lateral plane. 40 x 6.5 mm pedicle screws were placed at L 5 bilaterally and 40 x 6.5 mm screws placed at L 4 bilaterally and 45x 5.60mm screws were placed at L 3 bilaterally.  Final x-rays demonstrated well-positioned interbody grafts and pedicle screw fixation. 55 mm lordotic rod  Was placed on the left and a 60 mm rod was placed on the right and locked down in situ and the posterolateral region was packed with  30 cc  bone autograft on each side with small BMP.  Long-acting Marcaine was injected in the deep musculature.   Fascia was closed with 1 Vicryl sutures skin edges were reapproximated 2 and 3-0 Vicryl sutures. The wound is dressed with Dermabond and an occlusive  dressing. The patient was extubated in the operating room and taken to recovery in stable satisfactory condition having tolerated the operation well. Counts were correct at the end of the case.    PLAN OF CARE: Admit to inpatient   PATIENT DISPOSITION:  PACU - hemodynamically stable.   Delay start of Pharmacological VTE agent (>24hrs) due to surgical blood loss or risk of bleeding:  yes

## 2019-12-04 NOTE — Progress Notes (Signed)
Awake, alert, conversant.  Full bilateral lower extremity strength.  Less perineal numbness per patient.  Doing well.

## 2019-12-04 NOTE — Progress Notes (Signed)
PHARMACIST - PHYSICIAN ORDER COMMUNICATION  CONCERNING: P&T Medication Policy on Herbal Medications  DESCRIPTION:  This patient's order for:  Biotin  has been noted.  This product(s) is classified as an "herbal" or natural product. Due to a lack of definitive safety studies or FDA approval, nonstandard manufacturing practices, plus the potential risk of unknown drug-drug interactions while on inpatient medications, the Pharmacy and Therapeutics Committee does not permit the use of "herbal" or natural products of this type within [facility].   ACTION TAKEN: The pharmacy department is unable to verify this order at this time and your patient has been informed of this safety policy. Please reevaluate patient's clinical condition at discharge and address if the herbal or natural product(s) should be resumed at that time.  Thank you for involving pharmacy in this patient's care.  Renold Genta, PharmD, BCPS Clinical Pharmacist Clinical phone for 12/04/2019 until 11p is [phone] 12/04/2019 4:26 PM  **Pharmacist phone directory can be found on [email or web address] listed under [place]**

## 2019-12-04 NOTE — Evaluation (Signed)
Physical Therapy Evaluation Patient Details Name: Christine Mcintyre MRN: [id number] DOB: [birth year] Today's Date: 12/04/2019   History of Present Illness  Pt is an 85 y/o female s/p L3-5 decompression and fusion. PMH includes bilateral THA, R TKA, R shoulder replacement, and HTN.   Clinical Impression  Patient is s/p above surgery resulting in the deficits listed below (see PT Problem List). Pt with back pain, however, tolerated mobility fairly well. Required min to min guard for gait this session. Educated about generalized walking program and back precautions. Reports husband will be available to assist at home. Patient will benefit from skilled PT to increase their independence and safety with mobility (while adhering to their precautions) to allow discharge to the venue listed below.     Follow Up Recommendations No PT follow up;Supervision for mobility/OOB    Equipment Recommendations  None recommended by PT    Recommendations for Other Services       Precautions / Restrictions Precautions Precautions: Back Precaution Booklet Issued: Yes (comment) Precaution Comments: Reviewed back precautions with pt.  Required Braces or Orthoses: Spinal Brace Spinal Brace: Lumbar corset;Applied in sitting position Restrictions Weight Bearing Restrictions: No      Mobility  Bed Mobility Overal bed mobility: Needs Assistance Bed Mobility: Rolling;Sidelying to Sit;Sit to Sidelying Rolling: Supervision Sidelying to sit: Min guard     Sit to sidelying: Min assist General bed mobility comments: Supervision to min guard to sit EOB in order to ensure precautions. Min A for LE assist to return to sidelying. Cues for sequencing using log roll technique.   Transfers Overall transfer level: Needs assistance Equipment used: None Transfers: Sit to/from Stand Sit to Stand: Min assist         General transfer comment: Min A for steadying assist.   Ambulation/Gait Ambulation/Gait  assistance: Min guard Gait Distance (Feet): 100 Feet Assistive device: IV Pole Gait Pattern/deviations: Step-through pattern;Decreased stride length Gait velocity: Decreased   General Gait Details: Slow, guarded gait secondary to pain. Very short steps. Educated about generalized walking program to perform at home  Stairs            Wheelchair Mobility    Modified Rankin (Stroke Patients Only)       Balance Overall balance assessment: Needs assistance Sitting-balance support: No upper extremity supported;Feet supported Sitting balance-Leahy Scale: Good     Standing balance support: Single extremity supported;No upper extremity supported;During functional activity Standing balance-Leahy Scale: Fair Standing balance comment: Able to maintain static standing without UE support                              Pertinent Vitals/Pain Pain Assessment: Faces Faces Pain Scale: Hurts even more Pain Location: back  Pain Descriptors / Indicators: Grimacing;Guarding Pain Intervention(s): Limited activity within patient's tolerance;Monitored during session;Repositioned    Home Living Family/patient expects to be discharged to:: Private residence Living Arrangements: Spouse/significant other Available Help at Discharge: Family Type of Home: House Home Access: Stairs to enter Entrance Stairs-Rails: Left Entrance Stairs-Number of Steps: 3 Home Layout: One level Home Equipment: Environmental consultant - 2 wheels;Shower seat - built in;Grab bars - tub/shower      Prior Function Level of Independence: Independent               Hand Dominance        Extremity/Trunk Assessment   Upper Extremity Assessment Upper Extremity Assessment: Defer to OT evaluation    Lower Extremity Assessment Lower Extremity Assessment:  Generalized weakness    Cervical / Trunk Assessment Cervical / Trunk Assessment: Other exceptions Cervical / Trunk Exceptions: s/p lumbar surgery    Communication   Communication: No difficulties  Cognition Arousal/Alertness: Awake/alert Behavior During Therapy: WFL for tasks assessed/performed Overall Cognitive Status: Within Functional Limits for tasks assessed                                        General Comments General comments (skin integrity, edema, etc.): Pt's husband present     Exercises     Assessment/Plan    PT Assessment Patient needs continued PT services  PT Problem List Decreased strength;Decreased balance;Decreased mobility;Decreased activity tolerance;Decreased knowledge of precautions;Pain       PT Treatment Interventions DME instruction;Functional mobility training;Therapeutic activities;Stair training;Gait training;Therapeutic exercise;Balance training;Patient/family education    PT Goals (Current goals can be found in the Care Plan section)  Acute Rehab PT Goals Patient Stated Goal: to go home PT Goal Formulation: With patient Time For Goal Achievement: 12/18/19 Potential to Achieve Goals: Good    Frequency Min 5X/week   Barriers to discharge        Co-evaluation               AM-PAC PT "6 Clicks" Mobility  Outcome Measure Help needed turning from your back to your side while in a flat bed without using bedrails?: A Little Help needed moving from lying on your back to sitting on the side of a flat bed without using bedrails?: A Little Help needed moving to and from a bed to a chair (including a wheelchair)?: A Little Help needed standing up from a chair using your arms (e.g., wheelchair or bedside chair)?: A Little Help needed to walk in hospital room?: A Little Help needed climbing 3-5 steps with a railing? : A Lot 6 Click Score: 17    End of Session Equipment Utilized During Treatment: Gait belt;Back brace Activity Tolerance: Patient tolerated treatment well Patient left: in bed;with call bell/phone within reach;with family/visitor present Nurse Communication:  Mobility status PT Visit Diagnosis: Other abnormalities of gait and mobility (R26.89);Muscle weakness (generalized) (M62.81)    Time: [id number] PT Time Calculation (min) (ACUTE ONLY): 19 min   Charges:   PT Evaluation $PT Eval Low Complexity: 1 Low          Christine Mcintyre, DPT  Acute Rehabilitation Services  Pager: [phone] Office: [phone]   Rudean Hitt 12/04/2019, 5:58 PM

## 2019-12-04 NOTE — Anesthesia Procedure Notes (Addendum)
Procedure Name: Intubation Date/Time: 12/04/2019 11:36 AM Performed by: Shirlyn Goltz, CRNA Pre-anesthesia Checklist: Patient identified, Emergency Drugs available, Suction available and Patient being monitored Patient Re-evaluated:Patient Re-evaluated prior to induction Oxygen Delivery Method: Circle system utilized Preoxygenation: Pre-oxygenation with 100% oxygen Induction Type: IV induction Ventilation: Mask ventilation without difficulty Laryngoscope Size: Mac and 3 Grade View: Grade III Tube type: Oral Tube size: 7.0 mm Number of attempts: 1 Airway Equipment and Method: Stylet Placement Confirmation: positive ETCO2 and breath sounds checked- equal and bilateral Secured at: 21 cm Tube secured with: Tape Dental Injury: Teeth and Oropharynx as per pre-operative assessment

## 2019-12-04 NOTE — Anesthesia Postprocedure Evaluation (Signed)
Anesthesia Post Note  Patient: Ciella Munsch  Procedure(s) Performed: Lumbar Three-Four Lumbar Four-Five Decompression/fusion (N/A Back)     Patient location during evaluation: PACU Anesthesia Type: General Level of consciousness: awake and alert Pain management: pain level controlled Vital Signs Assessment: post-procedure vital signs reviewed and stable Respiratory status: spontaneous breathing, nonlabored ventilation and respiratory function stable Cardiovascular status: blood pressure returned to baseline and stable Postop Assessment: no apparent nausea or vomiting Anesthetic complications: no    Last Vitals:  Vitals:   12/04/19 1605 12/04/19 1625  BP: (!) 146/87 (!) 145/87  Pulse: 65 66  Resp: 14 18  Temp: (!) 36.1 C 36.9 C  SpO2: 100% 98%    Last Pain:  Vitals:   12/04/19 1625  TempSrc: Oral  PainSc:                  Katelinn Justice,W. EDMOND

## 2019-12-04 NOTE — Interval H&P Note (Signed)
History and Physical Interval Note:  12/04/2019 10:55 AM  Christine Mcintyre  has presented today for surgery, with the diagnosis of Spondylolisthesis, Lumbar region.  The various methods of treatment have been discussed with the patient and family. After consideration of risks, benefits and other options for treatment, the patient has consented to  Procedure(s) with comments: Lumbar 3-4 Lumbar 4-5 Decompression/fusion (N/A) - No interbody as a surgical intervention.  The patient's history has been reviewed, patient examined, no change in status, stable for surgery.  I have reviewed the patient's chart and labs.  Questions were answered to the patient's satisfaction.     [name]

## 2019-12-04 NOTE — Transfer of Care (Signed)
Immediate Anesthesia Transfer of Care Note  Patient: Christine Mcintyre  Procedure(s) Performed: Lumbar Three-Four Lumbar Four-Five Decompression/fusion (N/A Back)  Patient Location: PACU  Anesthesia Type:General  Level of Consciousness: awake, alert  and oriented  Airway & Oxygen Therapy: Patient Spontanous Breathing and Patient connected to nasal cannula oxygen  Post-op Assessment: Report given to RN, Post -op Vital signs reviewed and stable and Patient moving all extremities X 4  Post vital signs: Reviewed and stable  Last Vitals:  Vitals Value Taken Time  BP 158/73 12/04/19 1506  Temp    Pulse 64 12/04/19 1508  Resp 15 12/04/19 1508  SpO2 100 % 12/04/19 1508  Vitals shown include unvalidated device data.  Last Pain:  Vitals:   12/04/19 0955  TempSrc:   PainSc: 0-No pain      Patients Stated Pain Goal: 5 ([id number])  Complications: No apparent anesthesia complications

## 2019-12-04 NOTE — Op Note (Signed)
12/04/2019  3:07 PM  PATIENT:  Christine Mcintyre  85 y.o. female  PRE-OPERATIVE DIAGNOSIS:  Spondylolisthesis, Lumbar region, critical spinal stenosis L45 with cauda equina syndrome, scoliosis, lumbago, radiculopathy L 34 and L 45 levels  POST-OPERATIVE DIAGNOSIS:   Spondylolisthesis, Lumbar region, critical spinal stenosis L45 with cauda equina syndrome, scoliosis, lumbago, radiculopathy L 34 and L 45 levels  PROCEDURE:  Procedure(s) with comments: Lumbar Three-Four Lumbar Four-Five Decompression/fusion (N/A) - No interbody with pedicle screw fixation and posterolateral arthrodesis  SURGEON:  Surgeon(s) and Role:    Erline Levine, MD - Primary    * Vallarie Mare, MD - Assisting  PHYSICIAN ASSISTANT:   ASSISTANTS: Poteat, RN   ANESTHESIA:   general  EBL:  300 mL   BLOOD ADMINISTERED:none  DRAINS: none   LOCAL MEDICATIONS USED:  MARCAINE    and LIDOCAINE   SPECIMEN:  No Specimen  DISPOSITION OF SPECIMEN:  N/A  COUNTS:  YES  TOURNIQUET:  * No tourniquets in log *  DICTATION: Patient is 85 year old woman with  HNP, spondylosis, scoliosis, stenosis, DDD, cauda equina syndrome, radiculopathy L 34, L 45 levels. She has a severe bilateral leg pain and weakness with loss of bowel/bladder control. It was elected to take her to surgery for decompression and fusion at L 34, L 45  levels.    Procedure: Patient was placed in a prone position on the [place] table after smooth and uncomplicated induction of general endotracheal anesthesia. Her low back was prepped and draped in usual sterile fashion with betadine scrub and DuraPrep after preoperative localization with C arm with LessRay. Area of incision was infiltrated with local lidocaine. Incision was made to the lumbodorsal fascia was incised and exposure was performed of the L 34, L 45 spinous processes laminae facet joint and transverse processes. Intraoperative x-ray was obtained which confirmed correct orientation. A total  laminectomy of L 3 and L 4 was performed with disarticulation of the facet joints at this level and thorough decompression was performed of both L 3, L 4 and L 5 nerve roots along with the common dural tube. There was densely adherent spondylytic material compressing the thecal sac and both L4 and L5 nerve roots.  A small durotomy was made on the left at the level of the L 4 pedicle along the lateral thecal sac.  Arachnoid was visible, but there was no leakage of CSF, so I protected the site with gelfoam.  Decompression was greater than would be typically performed for simple interbody fusion. Elected to not place interbody cages due to the patient's poor bone quality. We The posterolateral region was extensively decorticated and pedicle probes were placed at L 3, L 4 and L 5 bilaterally. Intraoperative fluoroscopy confirmed correct orientation in the AP and lateral plane. 40 x 6.5 mm pedicle screws were placed at L 5 bilaterally and 40 x 6.5 mm screws placed at L 4 bilaterally and 45x 5.6mm screws were placed at L 3 bilaterally.  Final x-rays demonstrated well-positioned interbody grafts and pedicle screw fixation. 55 mm lordotic rod  Was placed on the left and a 60 mm rod was placed on the right and locked down in situ and the posterolateral region was packed with  30 cc  bone autograft on each side with small BMP.  Long-acting Marcaine was injected in the deep musculature.   Fascia was closed with 1 Vicryl sutures skin edges were reapproximated 2 and 3-0 Vicryl sutures. The wound is dressed with Dermabond and an occlusive  dressing. The patient was extubated in the operating room and taken to recovery in stable satisfactory condition having tolerated the operation well. Counts were correct at the end of the case.    PLAN OF CARE: Admit to inpatient   PATIENT DISPOSITION:  PACU - hemodynamically stable.   Delay start of Pharmacological VTE agent (>24hrs) due to surgical blood loss or risk of bleeding: yes

## 2019-12-05 DIAGNOSIS — M4316 Spondylolisthesis, lumbar region: Secondary | ICD-10-CM | POA: Diagnosis not present

## 2019-12-05 MED ORDER — HYDROCODONE-ACETAMINOPHEN 5-325 MG PO TABS: 2.0000 | ORAL_TABLET | ORAL | 0 refills | Status: AC | PRN

## 2019-12-05 MED ORDER — METHOCARBAMOL 500 MG PO TABS: 500.0000 mg | ORAL_TABLET | Freq: Four times a day (QID) | ORAL | 1 refills | Status: AC | PRN

## 2019-12-05 NOTE — Care Management CC44 (Signed)
Condition Code 44 Documentation Completed  Patient Details  Name: Nejla Trojan MRN: [id number] Date of Birth: 07/03/1935   Condition Code 44 given:  Yes Patient signature on Condition Code 44 notice:  Yes Documentation of 2 MD's agreement:  Yes Code 44 added to claim:  Yes    Angelita Ingles, RN 12/05/2019, 9:40 AM

## 2019-12-05 NOTE — Plan of Care (Signed)
Patient alert and oriented, mae's well, voiding adequate amount of urine, swallowing without difficulty, no c/o pain at time of discharge. Patient discharged home with family. Script and discharged instructions given to patient. Patient and family stated understanding of instructions given. Patient has an appointment with Dr.Stern    

## 2019-12-05 NOTE — Care Management Obs Status (Signed)
[place] NOTIFICATION   Patient Details  Name: Christine Mcintyre MRN: [id number] Date of Birth: [birth year]   Medicare Observation Status Notification Given:       Angelita Ingles, RN 12/05/2019, 9:29 AM

## 2019-12-05 NOTE — Progress Notes (Signed)
Subjective: Patient reports feeling much better  Objective: Vital signs in last 24 hours: Temp:  [97 F (36.1 C)-98.7 F (37.1 C)] 98.3 F (36.8 C) (04/07 0329) Pulse Rate:  [59-73] 61 (04/07 0329) Resp:  [8-19] 18 (04/07 0329) BP: (128-160)/(50-99) 128/60 (04/07 0329) SpO2:  [94 %-100 %] 97 % (04/07 0329) Weight:  [55.3 kg] 55.3 kg (04/06 0939)  Intake/Output from previous day: 04/06 0701 - 04/07 0700 In: 1540 [P.O.:240; I.V.:1100; IV Piggyback:200] Out: 785 [Urine:485; Blood:300] Intake/Output this shift: No intake/output data recorded.  Physical Exam: Strength full bilateral PF/DF/EHL.  No numbness in legs or perineum.  Dressing CDI.  Lab Results: No results for input(s): WBC, HGB, HCT, PLT in the last 72 hours. BMET No results for input(s): NA, K, CL, CO2, GLUCOSE, BUN, CREATININE, CALCIUM in the last 72 hours.  Studies/Results: DG Lumbar Spine 2-3 Views  Result Date: 12/04/2019 CLINICAL DATA:  L3-4 and L4-5 posterior fusion. EXAM: DG C-ARM 1-60 MIN; LUMBAR SPINE - 2-3 VIEW COMPARISON:  Lumbar MRI 08/08/2019 FINDINGS: AP and lateral C-arm images were obtained in the operating room. Bilateral pedicle screws are present at L3, L4, and L5 in good position. Posterior connecting rods have not yet been placed. IMPRESSION: Pedicle screw placement bilaterally L3, L4, L5 Electronically Signed   By: Franchot Gallo M.D.   On: 12/04/2019 14:58   DG C-Arm 1-60 Min  Result Date: 12/04/2019 CLINICAL DATA:  L3-4 and L4-5 posterior fusion. EXAM: DG C-ARM 1-60 MIN; LUMBAR SPINE - 2-3 VIEW COMPARISON:  Lumbar MRI 08/08/2019 FINDINGS: AP and lateral C-arm images were obtained in the operating room. Bilateral pedicle screws are present at L3, L4, and L5 in good position. Posterior connecting rods have not yet been placed. IMPRESSION: Pedicle screw placement bilaterally L3, L4, L5 Electronically Signed   By: Franchot Gallo M.D.   On: 12/04/2019 14:58    Assessment/Plan: Doing well.  Mobilize  with PT.  Discharge home.    LOS: 1 day    [name], MD 12/05/2019, 8:08 AM

## 2019-12-05 NOTE — Discharge Instructions (Signed)
Wound Care Remove dressing in two days Leave incision open to air. You may shower. Do not scrub directly on incision.  Do not put any creams, lotions, or ointments on incision. Remove Glue in Two weeks Activity Walk each and every day, increasing distance each day. No lifting greater than 5 lbs.  Avoid bending, Lifting, and twisting. No driving for 2 weeks; may ride as a passenger locally. If provided with back brace, wear when out of bed.  It is not necessary to wear in bed. Diet Resume your normal diet.  Return to Work Will be discussed at you follow up appointment. Call Your Doctor If Any of These Occur Redness, drainage, or swelling at the wound.  Temperature greater than 101 degrees. Severe pain not relieved by pain medication. Incision starts to come apart. Follow Up Appt Call today for appointment in 3-4 weeks [id number]) or for problems.  If you have any hardware placed in your spine, you will need an x-ray before your appointment.

## 2019-12-05 NOTE — Evaluation (Signed)
Occupational Therapy Evaluation Patient Details Name: Christine Mcintyre MRN: [id number] DOB: 10-10-1934 Today's Date: 12/05/2019    History of Present Illness Pt is an 85 y/o female s/p L3-5 decompression and fusion. PMH includes bilateral THA, R TKA, R shoulder replacement, and HTN.    Clinical Impression   PTA, pt was living at home with her husband. Pt was independent with ADL/IADL and functional mobility without use of AD. 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 Pt currently requires supervision with dressing, grooming, and bathing. She required minA for functional mobility with hand held assistance but demonstrated good stability with light support from therapist. Pt may benefit from use of spc, PT aware. Pt reports her husband will be able to assist her as needed. Educated pt on adherence to back precautions during ADL/IADL and functional mobility. Pt will benefit from continued acute OT services to maximize safety and independence with ADL/IADL and functional mobility to allow d/c home with her husband.     Follow Up Recommendations  No OT follow up;Supervision - Intermittent    Equipment Recommendations  None recommended by OT    Recommendations for Other Services       Precautions / Restrictions Precautions Precautions: Back Precaution Booklet Issued: Yes (comment) Precaution Comments: Reviewed back precautions with pt.  Required Braces or Orthoses: Spinal Brace Spinal Brace: Lumbar corset;Applied in sitting position Restrictions Weight Bearing Restrictions: No      Mobility Bed Mobility Overal bed mobility: Needs Assistance Bed Mobility: Rolling;Sidelying to Sit Rolling: Supervision Sidelying to sit: Supervision       General bed mobility comments: supervision for safety1 vc to initiate log rolling technique  Transfers Overall transfer level: Needs assistance Equipment used: 1 person hand held assist Transfers: Sit  to/from Stand Sit to Stand: Min guard         General transfer comment: minguard for safety and 1 person hand held for stability    Balance Overall balance assessment: Needs assistance Sitting-balance support: No upper extremity supported;Feet supported Sitting balance-Leahy Scale: Good     Standing balance support: Single extremity supported;No upper extremity supported;During functional activity Standing balance-Leahy Scale: Fair Standing balance comment: Able to maintain static standing without UE support;preference for single UE support with mobility to improve stability                           ADL either performed or assessed with clinical judgement   ADL Overall ADL's : Needs assistance/impaired Eating/Feeding: Supervision/ safety   Grooming: Supervision/safety;Standing   Upper Body Bathing: Supervision/ safety   Lower Body Bathing: Supervison/ safety;Sit to/from stand   Upper Body Dressing : Supervision/safety;Sitting Upper Body Dressing Details (indicate cue type and reason): donned back brace Lower Body Dressing: Supervision/safety   Toilet Transfer: Ambulation;Minimal assistance Toilet Transfer Details (indicate cue type and reason): simulated in room; hand held assist for stability Toileting- Clothing Manipulation and Hygiene: Min guard;Sit to/from stand       Functional mobility during ADLs: Minimal assistance General ADL Comments: minA for 1 person hand held assistance with in room mobility;pt required intermittent VC for adherence to back precautions with      Vision         Perception     Praxis      Pertinent Vitals/Pain Pain Assessment: 0-10 Pain Score: 3  Pain Location: back  Pain Descriptors / Indicators: Sore Pain Intervention(s): Limited activity within patient's tolerance;Monitored during session  Hand Dominance Right   Extremity/Trunk Assessment Upper Extremity Assessment Upper Extremity Assessment: Overall WFL for  tasks assessed   Lower Extremity Assessment Lower Extremity Assessment: Defer to PT evaluation   Cervical / Trunk Assessment Cervical / Trunk Assessment: Other exceptions Cervical / Trunk Exceptions: s/p lumbar surgery    Communication Communication Communication: No difficulties   Cognition Arousal/Alertness: Awake/alert Behavior During Therapy: WFL for tasks assessed/performed Overall Cognitive Status: Within Functional Limits for tasks assessed                                 General Comments: pt able to problem solve through hypothetical scenarios for adherence to back precautions    General Comments  BP 128/57 standing    Exercises     Shoulder Instructions      Home Living Family/patient expects to be discharged to:: Private residence Living Arrangements: Spouse/significant other Available Help at Discharge: Family Type of Home: House Home Access: Stairs to enter Technical brewer of Steps: 3 Entrance Stairs-Rails: Left Home Layout: One level     Bathroom Shower/Tub: Occupational psychologist: Handicapped height     Home Equipment: Environmental consultant - 2 wheels;Shower seat - built in;Grab bars - tub/shower          Prior Functioning/Environment Level of Independence: Independent                 OT Problem List: Impaired balance (sitting and/or standing);Decreased knowledge of precautions;Pain      OT Treatment/Interventions: Self-care/ADL training;DME and/or AE instruction;Patient/family education;Balance training    OT Goals(Current goals can be found in the care plan section) Acute Rehab OT Goals Patient Stated Goal: to go home OT Goal Formulation: With patient Time For Goal Achievement: 12/19/19 Potential to Achieve Goals: Good  OT Frequency: Min 2X/week   Barriers to D/C:            Co-evaluation              AM-PAC OT "6 Clicks" Daily Activity     Outcome Measure Help from another person eating meals?: A  Little Help from another person taking care of personal grooming?: A Little Help from another person toileting, which includes using toliet, bedpan, or urinal?: A Little Help from another person bathing (including washing, rinsing, drying)?: A Little Help from another person to put on and taking off regular upper body clothing?: A Little Help from another person to put on and taking off regular lower body clothing?: A Little 6 Click Score: 18   End of Session Equipment Utilized During Treatment: Back brace Nurse Communication: Mobility status  Activity Tolerance: Patient tolerated treatment well Patient left: (ambulating with PT in hallway)  OT Visit Diagnosis: Other abnormalities of gait and mobility (R26.89);Pain Pain - part of body: (back )                Time: [id number] OT Time Calculation (min): 19 min Charges:  OT General Charges $OT Visit: 1 Visit OT Evaluation $OT Eval Low Complexity: [employer] OTR/L Acute Rehabilitation Services Office: [place] 12/05/2019, 9:08 AM

## 2019-12-05 NOTE — Progress Notes (Signed)
Physical Therapy Treatment Patient Details Name: Christine Mcintyre MRN: [id number] DOB: [birth year] Today's Date: 12/05/2019    History of Present Illness Pt is an 85 y/o female s/p L3-5 decompression and fusion. PMH includes bilateral THA, R TKA, R shoulder replacement, and HTN.     PT Comments    Pt progressing well towards all goals. Pt with good recall of back precautions and adherence. Pt able to amb and complete stair negotiation with min guard. Pt safe to d/c home with spouse once medically stable. Educated on isometric abdominal exercises to help support back and walking program she can do at home. Acute PT to cont to follow.    Follow Up Recommendations  No PT follow up;Supervision for mobility/OOB     Equipment Recommendations  None recommended by PT    Recommendations for Other Services       Precautions / Restrictions Precautions Precautions: Back Precaution Booklet Issued: Yes (comment) Precaution Comments: Reviewed back precautions with pt.  Required Braces or Orthoses: Spinal Brace Spinal Brace: Lumbar corset;Applied in sitting position Restrictions Weight Bearing Restrictions: No    Mobility  Bed Mobility Overal bed mobility: Needs Assistance Bed Mobility: Rolling;Sidelying to Sit Rolling: Supervision Sidelying to sit: Supervision     Sit to sidelying: Supervision General bed mobility comments: verbal cues for technique to adhere to back precautions then return demonstrated appropriate technique  Transfers Overall transfer level: Needs assistance Equipment used: None Transfers: Sit to/from Stand Sit to Stand: Min guard         General transfer comment: min guard for safety has pt with low BP this morning, pt denies dizziness at this time  Ambulation/Gait Ambulation/Gait assistance: Min guard Gait Distance (Feet): 200 Feet Assistive device: None Gait Pattern/deviations: Step-through pattern;Decreased stride length Gait velocity:  Decreased Gait velocity interpretation: 1.31 - 2.62 ft/sec, indicative of limited community ambulator General Gait Details: pt with increased step length and fluidity with incresaed distance. Pt able to maintain upright posture.   Stairs Stairs: Yes Stairs assistance: Min guard Stair Management: One rail Right;Step to pattern;Forwards Number of Stairs: 3(to mimic home set up) General stair comments: pt educated on "up with the good, down with the bad" in her case her L knee is weak as she is planning for TKA, pt with good stability and return demonstrateion   Wheelchair Mobility    Modified Rankin (Stroke Patients Only)       Balance Overall balance assessment: Needs assistance Sitting-balance support: No upper extremity supported;Feet supported Sitting balance-Leahy Scale: Good     Standing balance support: Single extremity supported;No upper extremity supported;During functional activity Standing balance-Leahy Scale: Fair Standing balance comment: Able to maintain static standing without UE support;preference for single UE support with mobility to improve stability                            Cognition Arousal/Alertness: Awake/alert Behavior During Therapy: WFL for tasks assessed/performed Overall Cognitive Status: Within Functional Limits for tasks assessed                                 General Comments: pt with good recall of precautions, verbal cues to adhere functionally      Exercises      General Comments General comments (skin integrity, edema, etc.): vss, BP at 128/57, no report of lightheadedness while ambulating      Pertinent Vitals/Pain Pain Assessment: 0-10  Pain Score: 3  Pain Location: back  Pain Descriptors / Indicators: Sore Pain Intervention(s): Monitored during session    Home Living Family/patient expects to be discharged to:: Private residence Living Arrangements: Spouse/significant other Available Help at  Discharge: Family Type of Home: House Home Access: Stairs to enter Entrance Stairs-Rails: Left Home Layout: One level Home Equipment: Environmental consultant - 2 wheels;Shower seat - built in;Grab bars - tub/shower      Prior Function Level of Independence: Independent          PT Goals (current goals can now be found in the care plan section) Acute Rehab PT Goals Patient Stated Goal: to go home Progress towards PT goals: Progressing toward goals    Frequency    Min 5X/week      PT Plan Current plan remains appropriate    Co-evaluation              AM-PAC PT "6 Clicks" Mobility   Outcome Measure  Help needed turning from your back to your side while in a flat bed without using bedrails?: A Little Help needed moving from lying on your back to sitting on the side of a flat bed without using bedrails?: A Little Help needed moving to and from a bed to a chair (including a wheelchair)?: A Little Help needed standing up from a chair using your arms (e.g., wheelchair or bedside chair)?: A Little Help needed to walk in hospital room?: A Little Help needed climbing 3-5 steps with a railing? : A Little 6 Click Score: 18    End of Session Equipment Utilized During Treatment: Gait belt;Back brace Activity Tolerance: Patient tolerated treatment well Patient left: in bed;with call bell/phone within reach Nurse Communication: Mobility status PT Visit Diagnosis: Other abnormalities of gait and mobility (R26.89);Muscle weakness (generalized) (M62.81)     Time: [id number] PT Time Calculation (min) (ACUTE ONLY): 16 min  Charges:  $Gait Training: 8-22 mins                     Kittie Plater, PT, DPT Acute Rehabilitation Services Pager #: [phone] Office #: [phone]    Berline Lopes 12/05/2019, 10:20 AM

## 2019-12-05 NOTE — Progress Notes (Signed)
Physician Discharge Summary  Patient ID: Christine Mcintyre MRN: [id number] DOB/AGE: August 02, 1935 85 y.o.  Admit date: 12/04/2019 Discharge date: 12/05/2019  Admission Diagnoses:Scolioisis, cauda equina syndrome, severe spinal stenosis, lumbago, radiculopathy  Discharge Diagnoses: Scolioisis, cauda equina syndrome, severe spinal stenosis, lumbago, radiculopathy Active Problems:   Cauda equina syndrome [facility])   Discharged Condition: good  Hospital Course: Patient underwent decompression and fusion L 3 - L 5 levels.  She did well with surgery with improvement in radicular pain and weakness and also resolution of urinary incontinence.  Mobilized on POD 1 and discharged home.  Consults: None  Significant Diagnostic Studies: None  Treatments: surgery: Patient underwent decompression and fusion L 3 - L 5 levels  Discharge Exam: Blood pressure 128/60, pulse 61, temperature 98.3 F (36.8 C), temperature source Oral, resp. rate 18, height 5' 1.5" (1.562 m), weight 55.3 kg, SpO2 97 %. Neurologic: Alert and oriented X 3, normal strength and tone. Normal symmetric reflexes. Normal coordination and gait Wound:CDI  Disposition: Home  Discharge Instructions    Diet - low sodium heart healthy   Complete by: As directed    Increase activity slowly   Complete by: As directed      Allergies as of 12/05/2019      Reactions   Tafluprost Swelling, Other (See Comments)   This medication per patient is caused blurry vision and swelling of the left eye      Medication List    TAKE these medications   acetaminophen 650 MG CR tablet Commonly known as: TYLENOL Take 1,300 mg by mouth every 8 (eight) hours as needed for pain.   amLODipine 2.5 MG tablet Commonly known as: NORVASC Take 2.5 mg by mouth at bedtime.   Biotin 10000 MCG Tabs Take 10,000 mcg by mouth daily.   brimonidine 0.2 % ophthalmic solution Commonly known as: ALPHAGAN Place 1 drop into both eyes in the morning, at noon, and at  bedtime.   calcium carbonate 1250 (500 Ca) MG tablet Commonly known as: OS-CAL - dosed in mg of elemental calcium Take 1 tablet by mouth daily with breakfast.   dorzolamide 2 % ophthalmic solution Commonly known as: TRUSOPT Place 1 drop into both eyes 3 (three) times daily.   HYDROcodone-acetaminophen 5-325 MG tablet Commonly known as: NORCO/VICODIN Take 2 tablets by mouth every 4 (four) hours as needed for severe pain ((score 7 to 10)).   levothyroxine 88 MCG tablet Commonly known as: SYNTHROID Take 88 mcg by mouth daily before breakfast.   losartan 100 MG tablet Commonly known as: COZAAR Take 100 mg by mouth daily.   magnesium oxide 400 MG tablet Commonly known as: MAG-OX Take 400 mg by mouth daily.   methocarbamol 500 MG tablet Commonly known as: ROBAXIN Take 1 tablet (500 mg total) by mouth every 6 (six) hours as needed for muscle spasms.   metoprolol succinate 100 MG 24 hr tablet Commonly known as: TOPROL-XL Take 100 mg by mouth daily.   timolol 0.5 % ophthalmic solution Commonly known as: TIMOPTIC Place 1 drop into both eyes in the morning and at bedtime.   vitamin C 1000 MG tablet Take 1,000 mg by mouth daily.        Signed: [name], MD 12/05/2019, 8:05 AM

## 2019-12-06 NOTE — Discharge Summary (Signed)
Physician Discharge Summary  Patient ID: Christine Mcintyre MRN: [id number] DOB/AGE: [birth year] 83 y.o.  Admit date: 12/04/2019 Discharge date: 12/05/2019  Admission Diagnoses:Scolioisis, cauda equina syndrome, severe spinal stenosis, lumbago, radiculopathy  Discharge Diagnoses: Scolioisis, cauda equina syndrome, severe spinal stenosis, lumbago, radiculopathy Active Problems:   Cauda equina syndrome [facility])   Discharged Condition: good  Hospital Course: Patient underwent decompression and fusion L 3 - L 5 levels.  She did well with surgery with improvement in radicular pain and weakness and also resolution of urinary incontinence.  Mobilized on POD 1 and discharged home.  Consults: None  Significant Diagnostic Studies: None  Treatments: surgery: Patient underwent decompression and fusion L 3 - L 5 levels  Discharge Exam: Blood pressure 128/60, pulse 61, temperature 98.3 F (36.8 C), temperature source Oral, resp. rate 18, height 5' 1.5" (1.562 m), weight 55.3 kg, SpO2 97 %. Neurologic: Alert and oriented X 3, normal strength and tone. Normal symmetric reflexes. Normal coordination and gait Wound:CDI  Disposition: Home  Discharge Instructions    Diet - low sodium heart healthy   Complete by: As directed    Increase activity slowly   Complete by: As directed      Allergies as of 12/05/2019      Reactions   Tafluprost Swelling, Other (See Comments)   This medication per patient is caused blurry vision and swelling of the left eye      Medication List    TAKE these medications   acetaminophen 650 MG CR tablet Commonly known as: TYLENOL Take 1,300 mg by mouth every 8 (eight) hours as needed for pain.   amLODipine 2.5 MG tablet Commonly known as: NORVASC Take 2.5 mg by mouth at bedtime.   Biotin 10000 MCG Tabs Take 10,000 mcg by mouth daily.   brimonidine 0.2 % ophthalmic solution Commonly known as: ALPHAGAN Place 1 drop into both eyes in the morning, at noon, and at  bedtime.   calcium carbonate 1250 (500 Ca) MG tablet Commonly known as: OS-CAL - dosed in mg of elemental calcium Take 1 tablet by mouth daily with breakfast.   dorzolamide 2 % ophthalmic solution Commonly known as: TRUSOPT Place 1 drop into both eyes 3 (three) times daily.   HYDROcodone-acetaminophen 5-325 MG tablet Commonly known as: NORCO/VICODIN Take 2 tablets by mouth every 4 (four) hours as needed for severe pain ((score 7 to 10)).   levothyroxine 88 MCG tablet Commonly known as: SYNTHROID Take 88 mcg by mouth daily before breakfast.   losartan 100 MG tablet Commonly known as: COZAAR Take 100 mg by mouth daily.   magnesium oxide 400 MG tablet Commonly known as: MAG-OX Take 400 mg by mouth daily.   methocarbamol 500 MG tablet Commonly known as: ROBAXIN Take 1 tablet (500 mg total) by mouth every 6 (six) hours as needed for muscle spasms.   metoprolol succinate 100 MG 24 hr tablet Commonly known as: TOPROL-XL Take 100 mg by mouth daily.   timolol 0.5 % ophthalmic solution Commonly known as: TIMOPTIC Place 1 drop into both eyes in the morning and at bedtime.   vitamin C 1000 MG tablet Take 1,000 mg by mouth daily.        Signed: [name], MD 12/05/2019, 8:05 AM

## 2021-10-07 IMAGING — RF DG C-ARM 1-60 MIN
1 series · 3 of 3 positions shown · non-contrast
Comparison: Lumbar MRI 08/08/2019

CLINICAL DATA: L3-4 and L4-5 posterior fusion.

EXAM:
DG C-ARM 1-60 MIN; LUMBAR SPINE - 2-3 VIEW

[Series 1: run · 3 of 3 slices shown]
[im 1/3]
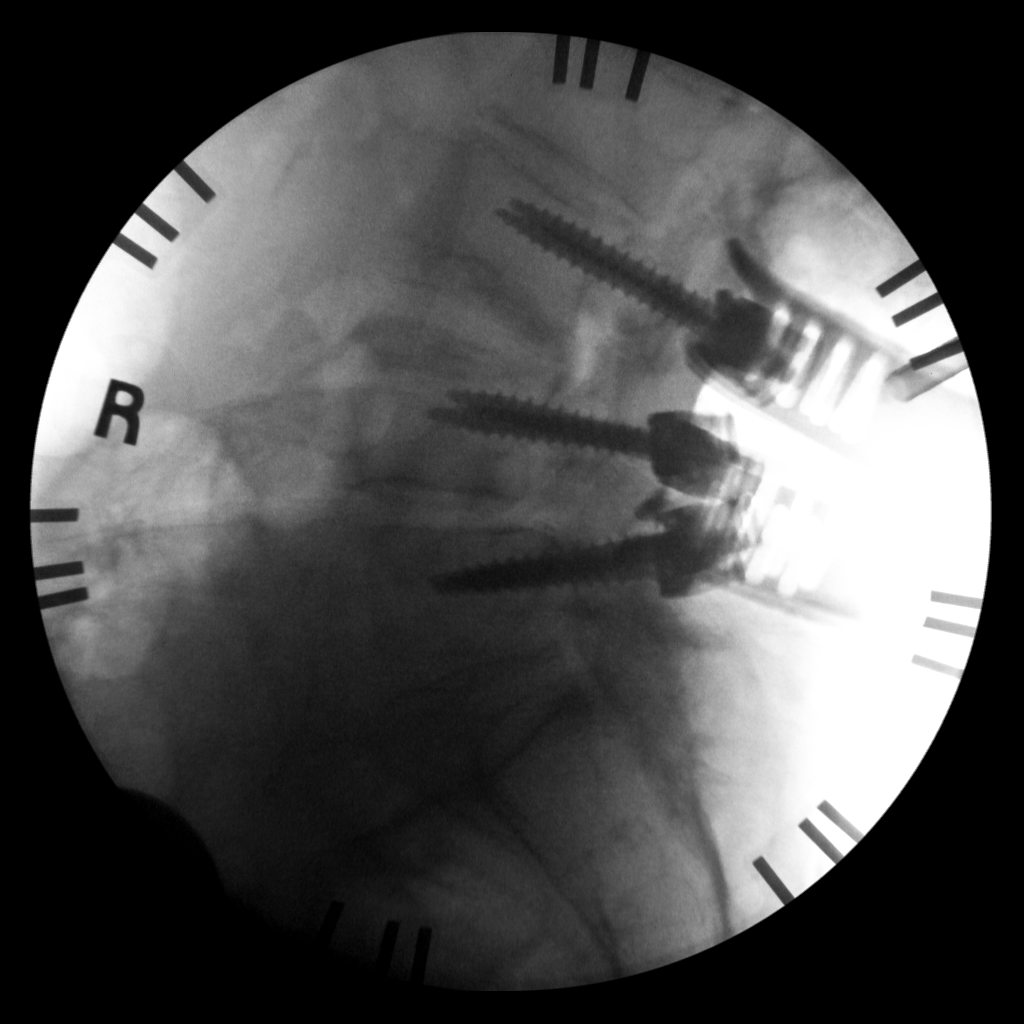
[im 2/3]
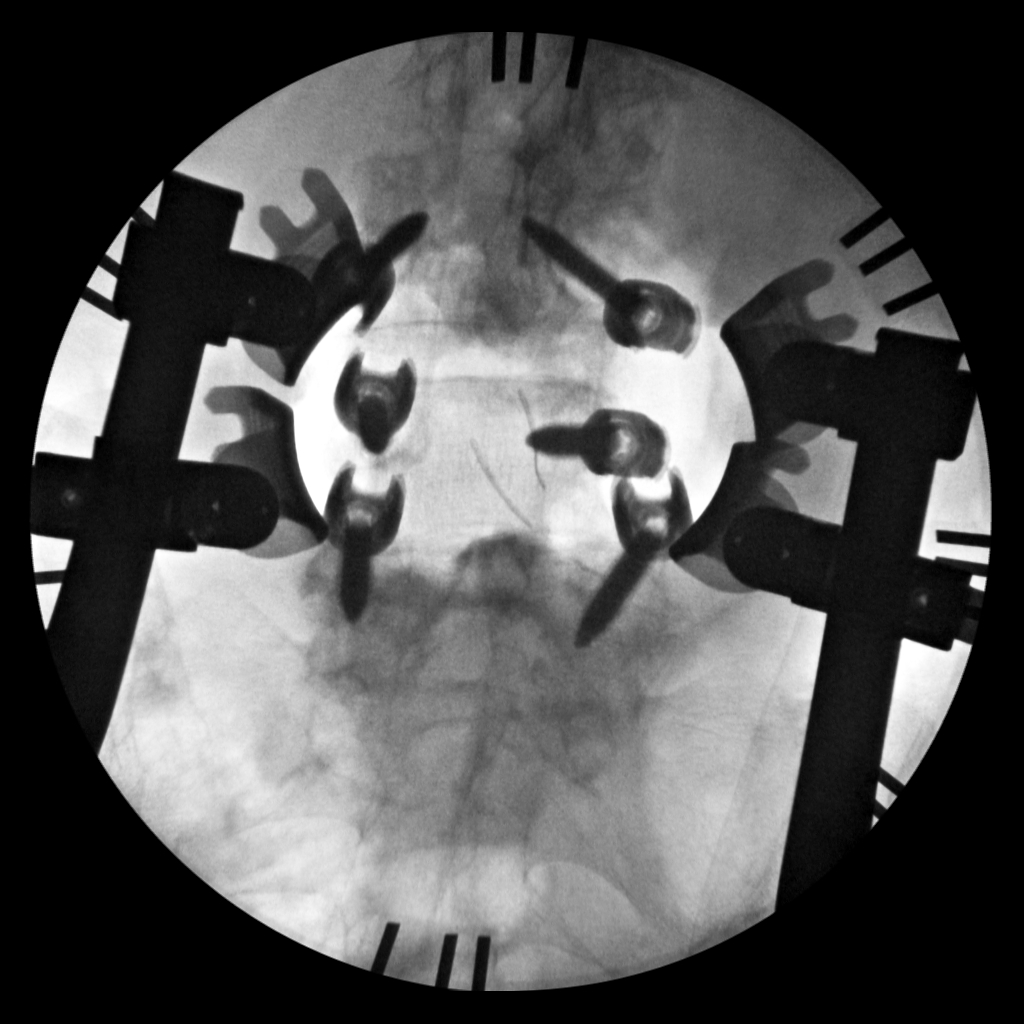
[im 3/3]
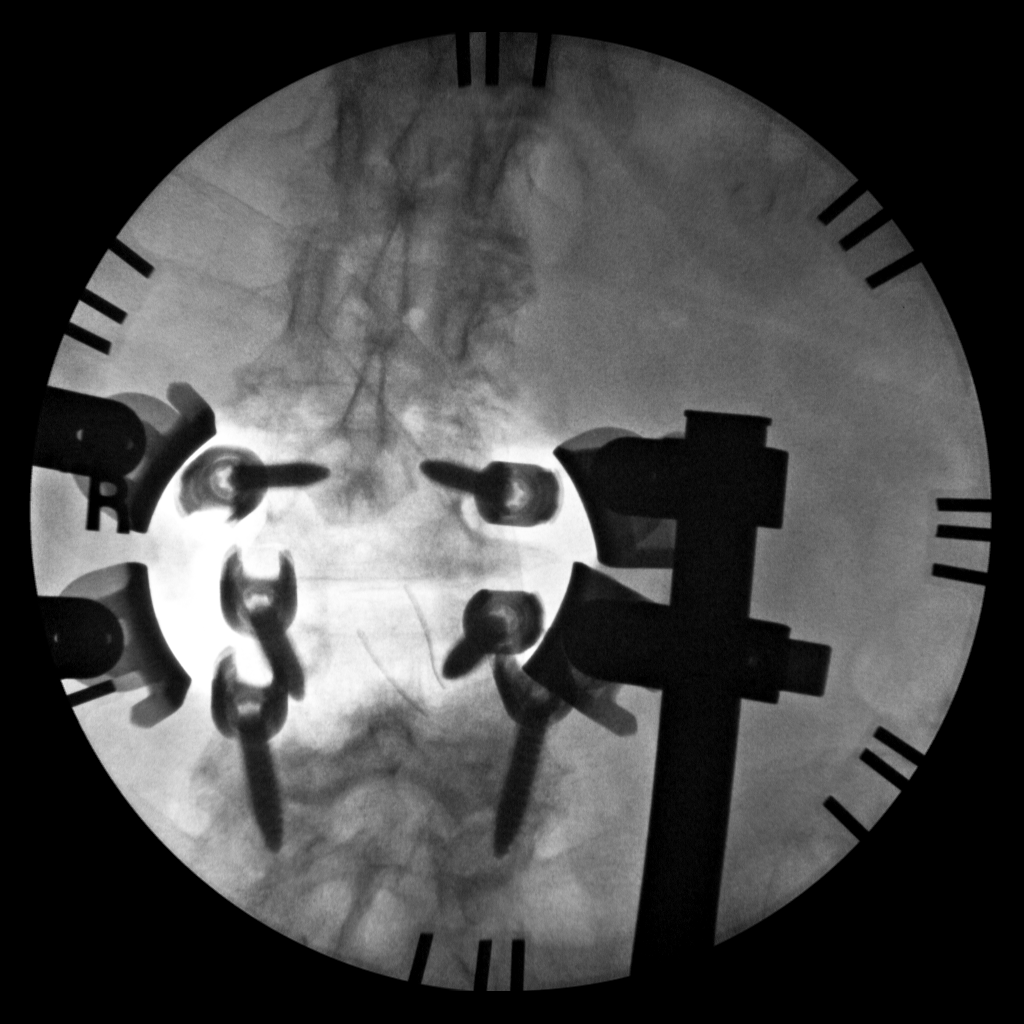

[3 of 3 positions shown; findings below may reference images not displayed]

FINDINGS: AP and lateral C-arm images were obtained in the operating room.
Bilateral pedicle screws are present at L3, L4, and L5 in good
position. Posterior connecting rods have not yet been placed.
IMPRESSION: Pedicle screw placement bilaterally L3, L4, L5

## 2021-11-09 ENCOUNTER — Encounter (INDEPENDENT_AMBULATORY_CARE_PROVIDER_SITE_OTHER): Payer: Medicare Other | Admitting: Ophthalmology

## 2021-11-09 ENCOUNTER — Other Ambulatory Visit: Payer: Self-pay

## 2021-11-09 DIAGNOSIS — H43811 Vitreous degeneration, right eye: Secondary | ICD-10-CM | POA: Diagnosis not present

## 2021-11-09 DIAGNOSIS — I10 Essential (primary) hypertension: Secondary | ICD-10-CM | POA: Diagnosis not present

## 2021-11-09 DIAGNOSIS — H348312 Tributary (branch) retinal vein occlusion, right eye, stable: Secondary | ICD-10-CM | POA: Diagnosis not present

## 2021-11-09 DIAGNOSIS — H35033 Hypertensive retinopathy, bilateral: Secondary | ICD-10-CM | POA: Diagnosis not present

## 2021-11-09 DIAGNOSIS — H2511 Age-related nuclear cataract, right eye: Secondary | ICD-10-CM

## 2021-11-30 ENCOUNTER — Encounter (INDEPENDENT_AMBULATORY_CARE_PROVIDER_SITE_OTHER): Payer: Medicare Other | Admitting: Ophthalmology

## 2021-11-30 DIAGNOSIS — H348312 Tributary (branch) retinal vein occlusion, right eye, stable: Secondary | ICD-10-CM | POA: Diagnosis not present

## 2021-11-30 DIAGNOSIS — H33302 Unspecified retinal break, left eye: Secondary | ICD-10-CM | POA: Diagnosis not present

## 2021-11-30 DIAGNOSIS — I10 Essential (primary) hypertension: Secondary | ICD-10-CM | POA: Diagnosis not present

## 2021-11-30 DIAGNOSIS — H35372 Puckering of macula, left eye: Secondary | ICD-10-CM | POA: Diagnosis not present

## 2021-11-30 DIAGNOSIS — H35033 Hypertensive retinopathy, bilateral: Secondary | ICD-10-CM

## 2021-11-30 DIAGNOSIS — H43811 Vitreous degeneration, right eye: Secondary | ICD-10-CM

## 2021-11-30 DIAGNOSIS — H2511 Age-related nuclear cataract, right eye: Secondary | ICD-10-CM

## 2021-12-30 ENCOUNTER — Encounter (INDEPENDENT_AMBULATORY_CARE_PROVIDER_SITE_OTHER): Payer: Medicare Other | Admitting: Ophthalmology

## 2021-12-30 DIAGNOSIS — H35372 Puckering of macula, left eye: Secondary | ICD-10-CM

## 2021-12-30 DIAGNOSIS — H35033 Hypertensive retinopathy, bilateral: Secondary | ICD-10-CM | POA: Diagnosis not present

## 2021-12-30 DIAGNOSIS — I10 Essential (primary) hypertension: Secondary | ICD-10-CM

## 2021-12-30 DIAGNOSIS — H34831 Tributary (branch) retinal vein occlusion, right eye, with macular edema: Secondary | ICD-10-CM

## 2021-12-30 DIAGNOSIS — H33302 Unspecified retinal break, left eye: Secondary | ICD-10-CM | POA: Diagnosis not present

## 2021-12-30 DIAGNOSIS — H43811 Vitreous degeneration, right eye: Secondary | ICD-10-CM

## 2022-01-27 ENCOUNTER — Encounter (INDEPENDENT_AMBULATORY_CARE_PROVIDER_SITE_OTHER): Payer: Medicare Other | Admitting: Ophthalmology

## 2022-01-27 DIAGNOSIS — D3132 Benign neoplasm of left choroid: Secondary | ICD-10-CM | POA: Diagnosis not present

## 2022-01-27 DIAGNOSIS — H35033 Hypertensive retinopathy, bilateral: Secondary | ICD-10-CM | POA: Diagnosis not present

## 2022-01-27 DIAGNOSIS — I10 Essential (primary) hypertension: Secondary | ICD-10-CM | POA: Diagnosis not present

## 2022-01-27 DIAGNOSIS — H43811 Vitreous degeneration, right eye: Secondary | ICD-10-CM | POA: Diagnosis not present

## 2022-01-27 DIAGNOSIS — H34831 Tributary (branch) retinal vein occlusion, right eye, with macular edema: Secondary | ICD-10-CM

## 2022-03-04 ENCOUNTER — Encounter (INDEPENDENT_AMBULATORY_CARE_PROVIDER_SITE_OTHER): Payer: Medicare Other | Admitting: Ophthalmology

## 2022-03-04 DIAGNOSIS — D3132 Benign neoplasm of left choroid: Secondary | ICD-10-CM | POA: Diagnosis not present

## 2022-03-04 DIAGNOSIS — H43811 Vitreous degeneration, right eye: Secondary | ICD-10-CM | POA: Diagnosis not present

## 2022-03-04 DIAGNOSIS — H35033 Hypertensive retinopathy, bilateral: Secondary | ICD-10-CM

## 2022-03-04 DIAGNOSIS — I10 Essential (primary) hypertension: Secondary | ICD-10-CM

## 2022-03-04 DIAGNOSIS — H34833 Tributary (branch) retinal vein occlusion, bilateral, with macular edema: Secondary | ICD-10-CM

## 2022-04-07 ENCOUNTER — Encounter (INDEPENDENT_AMBULATORY_CARE_PROVIDER_SITE_OTHER): Payer: Medicare Other | Admitting: Ophthalmology

## 2022-04-08 ENCOUNTER — Encounter (INDEPENDENT_AMBULATORY_CARE_PROVIDER_SITE_OTHER): Payer: Medicare Other | Admitting: Ophthalmology

## 2022-04-12 ENCOUNTER — Encounter (INDEPENDENT_AMBULATORY_CARE_PROVIDER_SITE_OTHER): Payer: Medicare Other | Admitting: Ophthalmology

## 2022-04-16 ENCOUNTER — Encounter (INDEPENDENT_AMBULATORY_CARE_PROVIDER_SITE_OTHER): Payer: Medicare Other | Admitting: Ophthalmology

## 2022-04-20 ENCOUNTER — Encounter (INDEPENDENT_AMBULATORY_CARE_PROVIDER_SITE_OTHER): Payer: Medicare Other | Admitting: Ophthalmology

## 2022-04-20 DIAGNOSIS — H35033 Hypertensive retinopathy, bilateral: Secondary | ICD-10-CM | POA: Diagnosis not present

## 2022-04-20 DIAGNOSIS — I10 Essential (primary) hypertension: Secondary | ICD-10-CM

## 2022-04-20 DIAGNOSIS — H43813 Vitreous degeneration, bilateral: Secondary | ICD-10-CM | POA: Diagnosis not present

## 2022-04-20 DIAGNOSIS — H348332 Tributary (branch) retinal vein occlusion, bilateral, stable: Secondary | ICD-10-CM | POA: Diagnosis not present

## 2022-05-17 ENCOUNTER — Encounter (INDEPENDENT_AMBULATORY_CARE_PROVIDER_SITE_OTHER): Payer: Medicare Other | Admitting: Ophthalmology

## 2022-05-19 ENCOUNTER — Encounter (INDEPENDENT_AMBULATORY_CARE_PROVIDER_SITE_OTHER): Payer: Medicare Other | Admitting: Ophthalmology

## 2022-05-19 DIAGNOSIS — H35033 Hypertensive retinopathy, bilateral: Secondary | ICD-10-CM

## 2022-05-19 DIAGNOSIS — H34831 Tributary (branch) retinal vein occlusion, right eye, with macular edema: Secondary | ICD-10-CM

## 2022-05-19 DIAGNOSIS — D3132 Benign neoplasm of left choroid: Secondary | ICD-10-CM

## 2022-05-19 DIAGNOSIS — H43811 Vitreous degeneration, right eye: Secondary | ICD-10-CM

## 2022-05-19 DIAGNOSIS — H59031 Cystoid macular edema following cataract surgery, right eye: Secondary | ICD-10-CM

## 2022-05-19 DIAGNOSIS — I10 Essential (primary) hypertension: Secondary | ICD-10-CM

## 2022-05-19 DIAGNOSIS — H35372 Puckering of macula, left eye: Secondary | ICD-10-CM

## 2022-05-19 DIAGNOSIS — H348322 Tributary (branch) retinal vein occlusion, left eye, stable: Secondary | ICD-10-CM

## 2022-06-16 ENCOUNTER — Encounter (INDEPENDENT_AMBULATORY_CARE_PROVIDER_SITE_OTHER): Payer: Medicare Other | Admitting: Ophthalmology

## 2022-06-16 DIAGNOSIS — H59031 Cystoid macular edema following cataract surgery, right eye: Secondary | ICD-10-CM | POA: Diagnosis not present

## 2022-06-16 DIAGNOSIS — H43811 Vitreous degeneration, right eye: Secondary | ICD-10-CM

## 2022-06-16 DIAGNOSIS — H34833 Tributary (branch) retinal vein occlusion, bilateral, with macular edema: Secondary | ICD-10-CM | POA: Diagnosis not present

## 2022-06-16 DIAGNOSIS — I10 Essential (primary) hypertension: Secondary | ICD-10-CM

## 2022-06-16 DIAGNOSIS — H35033 Hypertensive retinopathy, bilateral: Secondary | ICD-10-CM | POA: Diagnosis not present

## 2022-06-16 DIAGNOSIS — D3132 Benign neoplasm of left choroid: Secondary | ICD-10-CM

## 2022-07-14 ENCOUNTER — Encounter (INDEPENDENT_AMBULATORY_CARE_PROVIDER_SITE_OTHER): Payer: Medicare Other | Admitting: Ophthalmology

## 2022-07-14 DIAGNOSIS — H34832 Tributary (branch) retinal vein occlusion, left eye, with macular edema: Secondary | ICD-10-CM | POA: Diagnosis not present

## 2022-07-14 DIAGNOSIS — H35033 Hypertensive retinopathy, bilateral: Secondary | ICD-10-CM

## 2022-07-14 DIAGNOSIS — H348312 Tributary (branch) retinal vein occlusion, right eye, stable: Secondary | ICD-10-CM | POA: Diagnosis not present

## 2022-07-14 DIAGNOSIS — H43813 Vitreous degeneration, bilateral: Secondary | ICD-10-CM

## 2022-07-14 DIAGNOSIS — I10 Essential (primary) hypertension: Secondary | ICD-10-CM | POA: Diagnosis not present

## 2022-08-11 ENCOUNTER — Encounter (INDEPENDENT_AMBULATORY_CARE_PROVIDER_SITE_OTHER): Payer: Medicare Other | Admitting: Ophthalmology

## 2022-08-11 DIAGNOSIS — H35033 Hypertensive retinopathy, bilateral: Secondary | ICD-10-CM | POA: Diagnosis not present

## 2022-08-11 DIAGNOSIS — H43813 Vitreous degeneration, bilateral: Secondary | ICD-10-CM | POA: Diagnosis not present

## 2022-08-11 DIAGNOSIS — H34833 Tributary (branch) retinal vein occlusion, bilateral, with macular edema: Secondary | ICD-10-CM | POA: Diagnosis not present

## 2022-08-11 DIAGNOSIS — I10 Essential (primary) hypertension: Secondary | ICD-10-CM | POA: Diagnosis not present

## 2022-09-08 ENCOUNTER — Encounter (INDEPENDENT_AMBULATORY_CARE_PROVIDER_SITE_OTHER): Payer: Medicare Other | Admitting: Ophthalmology

## 2022-09-13 ENCOUNTER — Encounter (INDEPENDENT_AMBULATORY_CARE_PROVIDER_SITE_OTHER): Payer: Medicare Other | Admitting: Ophthalmology

## 2022-09-13 DIAGNOSIS — H34832 Tributary (branch) retinal vein occlusion, left eye, with macular edema: Secondary | ICD-10-CM

## 2022-10-11 ENCOUNTER — Encounter (INDEPENDENT_AMBULATORY_CARE_PROVIDER_SITE_OTHER): Payer: Medicare Other | Admitting: Ophthalmology

## 2022-10-11 DIAGNOSIS — H35033 Hypertensive retinopathy, bilateral: Secondary | ICD-10-CM

## 2022-10-11 DIAGNOSIS — I10 Essential (primary) hypertension: Secondary | ICD-10-CM

## 2022-10-11 DIAGNOSIS — H34833 Tributary (branch) retinal vein occlusion, bilateral, with macular edema: Secondary | ICD-10-CM

## 2022-10-11 DIAGNOSIS — H43813 Vitreous degeneration, bilateral: Secondary | ICD-10-CM

## 2022-11-08 ENCOUNTER — Encounter (INDEPENDENT_AMBULATORY_CARE_PROVIDER_SITE_OTHER): Payer: Medicare Other | Admitting: Ophthalmology

## 2022-11-08 DIAGNOSIS — H34832 Tributary (branch) retinal vein occlusion, left eye, with macular edema: Secondary | ICD-10-CM | POA: Diagnosis not present

## 2022-11-08 DIAGNOSIS — H348312 Tributary (branch) retinal vein occlusion, right eye, stable: Secondary | ICD-10-CM

## 2022-11-08 DIAGNOSIS — D3132 Benign neoplasm of left choroid: Secondary | ICD-10-CM

## 2022-11-08 DIAGNOSIS — H35033 Hypertensive retinopathy, bilateral: Secondary | ICD-10-CM

## 2022-11-08 DIAGNOSIS — I10 Essential (primary) hypertension: Secondary | ICD-10-CM | POA: Diagnosis not present

## 2022-11-08 DIAGNOSIS — H43813 Vitreous degeneration, bilateral: Secondary | ICD-10-CM

## 2022-12-13 ENCOUNTER — Encounter (INDEPENDENT_AMBULATORY_CARE_PROVIDER_SITE_OTHER): Payer: Medicare Other | Admitting: Ophthalmology

## 2022-12-13 DIAGNOSIS — I10 Essential (primary) hypertension: Secondary | ICD-10-CM

## 2022-12-13 DIAGNOSIS — H35033 Hypertensive retinopathy, bilateral: Secondary | ICD-10-CM | POA: Diagnosis not present

## 2022-12-13 DIAGNOSIS — H35372 Puckering of macula, left eye: Secondary | ICD-10-CM

## 2022-12-13 DIAGNOSIS — H43813 Vitreous degeneration, bilateral: Secondary | ICD-10-CM

## 2022-12-13 DIAGNOSIS — H34832 Tributary (branch) retinal vein occlusion, left eye, with macular edema: Secondary | ICD-10-CM | POA: Diagnosis not present

## 2022-12-13 DIAGNOSIS — D3132 Benign neoplasm of left choroid: Secondary | ICD-10-CM

## 2023-01-17 ENCOUNTER — Encounter (INDEPENDENT_AMBULATORY_CARE_PROVIDER_SITE_OTHER): Payer: Medicare Other | Admitting: Ophthalmology

## 2023-01-17 DIAGNOSIS — H348312 Tributary (branch) retinal vein occlusion, right eye, stable: Secondary | ICD-10-CM

## 2023-01-17 DIAGNOSIS — H34832 Tributary (branch) retinal vein occlusion, left eye, with macular edema: Secondary | ICD-10-CM | POA: Diagnosis not present

## 2023-01-17 DIAGNOSIS — H35033 Hypertensive retinopathy, bilateral: Secondary | ICD-10-CM

## 2023-01-17 DIAGNOSIS — I10 Essential (primary) hypertension: Secondary | ICD-10-CM

## 2023-01-17 DIAGNOSIS — H43811 Vitreous degeneration, right eye: Secondary | ICD-10-CM

## 2023-01-17 DIAGNOSIS — D3132 Benign neoplasm of left choroid: Secondary | ICD-10-CM

## 2023-02-21 ENCOUNTER — Encounter (INDEPENDENT_AMBULATORY_CARE_PROVIDER_SITE_OTHER): Payer: Medicare Other | Admitting: Ophthalmology

## 2023-02-21 DIAGNOSIS — D3132 Benign neoplasm of left choroid: Secondary | ICD-10-CM

## 2023-02-21 DIAGNOSIS — I10 Essential (primary) hypertension: Secondary | ICD-10-CM

## 2023-02-21 DIAGNOSIS — H43813 Vitreous degeneration, bilateral: Secondary | ICD-10-CM

## 2023-02-21 DIAGNOSIS — H35033 Hypertensive retinopathy, bilateral: Secondary | ICD-10-CM

## 2023-02-21 DIAGNOSIS — H34833 Tributary (branch) retinal vein occlusion, bilateral, with macular edema: Secondary | ICD-10-CM | POA: Diagnosis not present

## 2023-03-21 ENCOUNTER — Encounter (INDEPENDENT_AMBULATORY_CARE_PROVIDER_SITE_OTHER): Payer: Medicare Other | Admitting: Ophthalmology

## 2023-03-21 DIAGNOSIS — H43813 Vitreous degeneration, bilateral: Secondary | ICD-10-CM

## 2023-03-21 DIAGNOSIS — H34832 Tributary (branch) retinal vein occlusion, left eye, with macular edema: Secondary | ICD-10-CM

## 2023-03-21 DIAGNOSIS — H348312 Tributary (branch) retinal vein occlusion, right eye, stable: Secondary | ICD-10-CM | POA: Diagnosis not present

## 2023-03-21 DIAGNOSIS — D3132 Benign neoplasm of left choroid: Secondary | ICD-10-CM

## 2023-03-21 DIAGNOSIS — H35033 Hypertensive retinopathy, bilateral: Secondary | ICD-10-CM | POA: Diagnosis not present

## 2023-03-21 DIAGNOSIS — I10 Essential (primary) hypertension: Secondary | ICD-10-CM

## 2023-04-18 ENCOUNTER — Encounter (INDEPENDENT_AMBULATORY_CARE_PROVIDER_SITE_OTHER): Payer: Medicare Other | Admitting: Ophthalmology

## 2023-04-26 ENCOUNTER — Encounter (INDEPENDENT_AMBULATORY_CARE_PROVIDER_SITE_OTHER): Payer: Medicare Other | Admitting: Ophthalmology

## 2023-04-26 DIAGNOSIS — H35033 Hypertensive retinopathy, bilateral: Secondary | ICD-10-CM

## 2023-04-26 DIAGNOSIS — H34832 Tributary (branch) retinal vein occlusion, left eye, with macular edema: Secondary | ICD-10-CM

## 2023-04-26 DIAGNOSIS — D3132 Benign neoplasm of left choroid: Secondary | ICD-10-CM

## 2023-04-26 DIAGNOSIS — I10 Essential (primary) hypertension: Secondary | ICD-10-CM | POA: Diagnosis not present

## 2023-04-26 DIAGNOSIS — H348312 Tributary (branch) retinal vein occlusion, right eye, stable: Secondary | ICD-10-CM | POA: Diagnosis not present

## 2023-04-26 DIAGNOSIS — H43811 Vitreous degeneration, right eye: Secondary | ICD-10-CM

## 2023-05-24 ENCOUNTER — Encounter (INDEPENDENT_AMBULATORY_CARE_PROVIDER_SITE_OTHER): Payer: Medicare Other | Admitting: Ophthalmology

## 2023-05-24 DIAGNOSIS — H35033 Hypertensive retinopathy, bilateral: Secondary | ICD-10-CM

## 2023-05-24 DIAGNOSIS — H34832 Tributary (branch) retinal vein occlusion, left eye, with macular edema: Secondary | ICD-10-CM

## 2023-05-24 DIAGNOSIS — H348312 Tributary (branch) retinal vein occlusion, right eye, stable: Secondary | ICD-10-CM | POA: Diagnosis not present

## 2023-05-24 DIAGNOSIS — I10 Essential (primary) hypertension: Secondary | ICD-10-CM | POA: Diagnosis not present

## 2023-05-24 DIAGNOSIS — H43813 Vitreous degeneration, bilateral: Secondary | ICD-10-CM

## 2023-05-24 DIAGNOSIS — D3132 Benign neoplasm of left choroid: Secondary | ICD-10-CM

## 2023-06-21 ENCOUNTER — Encounter (INDEPENDENT_AMBULATORY_CARE_PROVIDER_SITE_OTHER): Payer: Medicare Other | Admitting: Ophthalmology

## 2023-06-24 ENCOUNTER — Encounter (INDEPENDENT_AMBULATORY_CARE_PROVIDER_SITE_OTHER): Payer: Medicare Other | Admitting: Ophthalmology

## 2023-06-24 DIAGNOSIS — I10 Essential (primary) hypertension: Secondary | ICD-10-CM | POA: Diagnosis not present

## 2023-06-24 DIAGNOSIS — H35033 Hypertensive retinopathy, bilateral: Secondary | ICD-10-CM | POA: Diagnosis not present

## 2023-06-24 DIAGNOSIS — H348312 Tributary (branch) retinal vein occlusion, right eye, stable: Secondary | ICD-10-CM | POA: Diagnosis not present

## 2023-06-24 DIAGNOSIS — H43813 Vitreous degeneration, bilateral: Secondary | ICD-10-CM

## 2023-06-24 DIAGNOSIS — H34832 Tributary (branch) retinal vein occlusion, left eye, with macular edema: Secondary | ICD-10-CM | POA: Diagnosis not present

## 2023-06-24 DIAGNOSIS — D3132 Benign neoplasm of left choroid: Secondary | ICD-10-CM

## 2023-07-22 ENCOUNTER — Encounter (INDEPENDENT_AMBULATORY_CARE_PROVIDER_SITE_OTHER): Payer: Medicare Other | Admitting: Ophthalmology

## 2023-07-22 DIAGNOSIS — H34833 Tributary (branch) retinal vein occlusion, bilateral, with macular edema: Secondary | ICD-10-CM | POA: Diagnosis not present

## 2023-07-22 DIAGNOSIS — H43813 Vitreous degeneration, bilateral: Secondary | ICD-10-CM | POA: Diagnosis not present

## 2023-07-22 DIAGNOSIS — I10 Essential (primary) hypertension: Secondary | ICD-10-CM | POA: Diagnosis not present

## 2023-07-22 DIAGNOSIS — H35033 Hypertensive retinopathy, bilateral: Secondary | ICD-10-CM

## 2023-08-18 ENCOUNTER — Encounter (INDEPENDENT_AMBULATORY_CARE_PROVIDER_SITE_OTHER): Payer: Medicare Other | Admitting: Ophthalmology

## 2023-08-18 DIAGNOSIS — I10 Essential (primary) hypertension: Secondary | ICD-10-CM | POA: Diagnosis not present

## 2023-08-18 DIAGNOSIS — H34833 Tributary (branch) retinal vein occlusion, bilateral, with macular edema: Secondary | ICD-10-CM | POA: Diagnosis not present

## 2023-08-18 DIAGNOSIS — H43811 Vitreous degeneration, right eye: Secondary | ICD-10-CM

## 2023-08-18 DIAGNOSIS — H35033 Hypertensive retinopathy, bilateral: Secondary | ICD-10-CM | POA: Diagnosis not present

## 2023-09-15 ENCOUNTER — Encounter (INDEPENDENT_AMBULATORY_CARE_PROVIDER_SITE_OTHER): Payer: Medicare Other | Admitting: Ophthalmology

## 2023-09-15 DIAGNOSIS — H35033 Hypertensive retinopathy, bilateral: Secondary | ICD-10-CM

## 2023-09-15 DIAGNOSIS — I10 Essential (primary) hypertension: Secondary | ICD-10-CM

## 2023-09-15 DIAGNOSIS — H34833 Tributary (branch) retinal vein occlusion, bilateral, with macular edema: Secondary | ICD-10-CM

## 2023-09-15 DIAGNOSIS — H43811 Vitreous degeneration, right eye: Secondary | ICD-10-CM

## 2023-09-15 DIAGNOSIS — D3132 Benign neoplasm of left choroid: Secondary | ICD-10-CM | POA: Diagnosis not present

## 2023-09-20 ENCOUNTER — Encounter (INDEPENDENT_AMBULATORY_CARE_PROVIDER_SITE_OTHER): Payer: Medicare Other | Admitting: Ophthalmology

## 2023-10-12 ENCOUNTER — Encounter (INDEPENDENT_AMBULATORY_CARE_PROVIDER_SITE_OTHER): Payer: Medicare Other | Admitting: Ophthalmology

## 2023-11-09 ENCOUNTER — Encounter (INDEPENDENT_AMBULATORY_CARE_PROVIDER_SITE_OTHER): Payer: Medicare Other | Admitting: Ophthalmology

## 2023-11-09 DIAGNOSIS — D3132 Benign neoplasm of left choroid: Secondary | ICD-10-CM | POA: Diagnosis not present

## 2023-11-09 DIAGNOSIS — H34833 Tributary (branch) retinal vein occlusion, bilateral, with macular edema: Secondary | ICD-10-CM | POA: Diagnosis not present

## 2023-11-09 DIAGNOSIS — H35033 Hypertensive retinopathy, bilateral: Secondary | ICD-10-CM

## 2023-11-09 DIAGNOSIS — H43811 Vitreous degeneration, right eye: Secondary | ICD-10-CM

## 2023-11-09 DIAGNOSIS — I10 Essential (primary) hypertension: Secondary | ICD-10-CM

## 2023-12-07 ENCOUNTER — Encounter (INDEPENDENT_AMBULATORY_CARE_PROVIDER_SITE_OTHER): Admitting: Ophthalmology

## 2023-12-07 DIAGNOSIS — H348312 Tributary (branch) retinal vein occlusion, right eye, stable: Secondary | ICD-10-CM

## 2023-12-07 DIAGNOSIS — I10 Essential (primary) hypertension: Secondary | ICD-10-CM

## 2023-12-07 DIAGNOSIS — H35033 Hypertensive retinopathy, bilateral: Secondary | ICD-10-CM | POA: Diagnosis not present

## 2023-12-07 DIAGNOSIS — H43811 Vitreous degeneration, right eye: Secondary | ICD-10-CM

## 2023-12-07 DIAGNOSIS — H34832 Tributary (branch) retinal vein occlusion, left eye, with macular edema: Secondary | ICD-10-CM | POA: Diagnosis not present

## 2023-12-07 DIAGNOSIS — D3132 Benign neoplasm of left choroid: Secondary | ICD-10-CM

## 2024-01-04 ENCOUNTER — Encounter (INDEPENDENT_AMBULATORY_CARE_PROVIDER_SITE_OTHER): Admitting: Ophthalmology

## 2024-01-04 DIAGNOSIS — H35033 Hypertensive retinopathy, bilateral: Secondary | ICD-10-CM

## 2024-01-04 DIAGNOSIS — H43813 Vitreous degeneration, bilateral: Secondary | ICD-10-CM

## 2024-01-04 DIAGNOSIS — H348312 Tributary (branch) retinal vein occlusion, right eye, stable: Secondary | ICD-10-CM | POA: Diagnosis not present

## 2024-01-04 DIAGNOSIS — I10 Essential (primary) hypertension: Secondary | ICD-10-CM

## 2024-01-04 DIAGNOSIS — D3132 Benign neoplasm of left choroid: Secondary | ICD-10-CM

## 2024-01-04 DIAGNOSIS — H34832 Tributary (branch) retinal vein occlusion, left eye, with macular edema: Secondary | ICD-10-CM

## 2024-02-01 ENCOUNTER — Encounter (INDEPENDENT_AMBULATORY_CARE_PROVIDER_SITE_OTHER): Admitting: Ophthalmology

## 2024-02-01 DIAGNOSIS — D3132 Benign neoplasm of left choroid: Secondary | ICD-10-CM

## 2024-02-01 DIAGNOSIS — I10 Essential (primary) hypertension: Secondary | ICD-10-CM | POA: Diagnosis not present

## 2024-02-01 DIAGNOSIS — H43811 Vitreous degeneration, right eye: Secondary | ICD-10-CM

## 2024-02-01 DIAGNOSIS — H348312 Tributary (branch) retinal vein occlusion, right eye, stable: Secondary | ICD-10-CM | POA: Diagnosis not present

## 2024-02-01 DIAGNOSIS — H35033 Hypertensive retinopathy, bilateral: Secondary | ICD-10-CM

## 2024-02-01 DIAGNOSIS — H34832 Tributary (branch) retinal vein occlusion, left eye, with macular edema: Secondary | ICD-10-CM

## 2024-02-29 ENCOUNTER — Encounter (INDEPENDENT_AMBULATORY_CARE_PROVIDER_SITE_OTHER): Admitting: Ophthalmology

## 2024-02-29 DIAGNOSIS — I10 Essential (primary) hypertension: Secondary | ICD-10-CM

## 2024-02-29 DIAGNOSIS — H35033 Hypertensive retinopathy, bilateral: Secondary | ICD-10-CM

## 2024-02-29 DIAGNOSIS — H34832 Tributary (branch) retinal vein occlusion, left eye, with macular edema: Secondary | ICD-10-CM

## 2024-02-29 DIAGNOSIS — D3132 Benign neoplasm of left choroid: Secondary | ICD-10-CM

## 2024-02-29 DIAGNOSIS — H348312 Tributary (branch) retinal vein occlusion, right eye, stable: Secondary | ICD-10-CM

## 2024-02-29 DIAGNOSIS — H43811 Vitreous degeneration, right eye: Secondary | ICD-10-CM

## 2024-04-04 ENCOUNTER — Encounter (INDEPENDENT_AMBULATORY_CARE_PROVIDER_SITE_OTHER): Admitting: Ophthalmology

## 2024-04-04 DIAGNOSIS — H43811 Vitreous degeneration, right eye: Secondary | ICD-10-CM

## 2024-04-04 DIAGNOSIS — H348312 Tributary (branch) retinal vein occlusion, right eye, stable: Secondary | ICD-10-CM

## 2024-04-04 DIAGNOSIS — I10 Essential (primary) hypertension: Secondary | ICD-10-CM

## 2024-04-04 DIAGNOSIS — H34832 Tributary (branch) retinal vein occlusion, left eye, with macular edema: Secondary | ICD-10-CM | POA: Diagnosis not present

## 2024-04-04 DIAGNOSIS — H35033 Hypertensive retinopathy, bilateral: Secondary | ICD-10-CM | POA: Diagnosis not present

## 2024-04-04 DIAGNOSIS — D3132 Benign neoplasm of left choroid: Secondary | ICD-10-CM

## 2024-05-02 ENCOUNTER — Encounter (INDEPENDENT_AMBULATORY_CARE_PROVIDER_SITE_OTHER): Admitting: Ophthalmology

## 2024-05-02 DIAGNOSIS — H35033 Hypertensive retinopathy, bilateral: Secondary | ICD-10-CM

## 2024-05-02 DIAGNOSIS — I10 Essential (primary) hypertension: Secondary | ICD-10-CM

## 2024-05-02 DIAGNOSIS — D3132 Benign neoplasm of left choroid: Secondary | ICD-10-CM

## 2024-05-02 DIAGNOSIS — H348312 Tributary (branch) retinal vein occlusion, right eye, stable: Secondary | ICD-10-CM

## 2024-05-02 DIAGNOSIS — H34832 Tributary (branch) retinal vein occlusion, left eye, with macular edema: Secondary | ICD-10-CM | POA: Diagnosis not present

## 2024-05-02 DIAGNOSIS — H43813 Vitreous degeneration, bilateral: Secondary | ICD-10-CM

## 2024-06-04 ENCOUNTER — Encounter (INDEPENDENT_AMBULATORY_CARE_PROVIDER_SITE_OTHER): Admitting: Ophthalmology

## 2024-06-04 DIAGNOSIS — H35033 Hypertensive retinopathy, bilateral: Secondary | ICD-10-CM | POA: Diagnosis not present

## 2024-06-04 DIAGNOSIS — I10 Essential (primary) hypertension: Secondary | ICD-10-CM | POA: Diagnosis not present

## 2024-06-04 DIAGNOSIS — H34832 Tributary (branch) retinal vein occlusion, left eye, with macular edema: Secondary | ICD-10-CM | POA: Diagnosis not present

## 2024-06-04 DIAGNOSIS — H348312 Tributary (branch) retinal vein occlusion, right eye, stable: Secondary | ICD-10-CM | POA: Diagnosis not present

## 2024-06-04 DIAGNOSIS — H43811 Vitreous degeneration, right eye: Secondary | ICD-10-CM

## 2024-06-04 DIAGNOSIS — D3132 Benign neoplasm of left choroid: Secondary | ICD-10-CM

## 2024-07-05 ENCOUNTER — Encounter (INDEPENDENT_AMBULATORY_CARE_PROVIDER_SITE_OTHER): Admitting: Ophthalmology

## 2024-07-05 DIAGNOSIS — H34832 Tributary (branch) retinal vein occlusion, left eye, with macular edema: Secondary | ICD-10-CM | POA: Diagnosis not present

## 2024-07-05 DIAGNOSIS — H348312 Tributary (branch) retinal vein occlusion, right eye, stable: Secondary | ICD-10-CM | POA: Diagnosis not present

## 2024-07-05 DIAGNOSIS — I10 Essential (primary) hypertension: Secondary | ICD-10-CM | POA: Diagnosis not present

## 2024-07-05 DIAGNOSIS — H43811 Vitreous degeneration, right eye: Secondary | ICD-10-CM

## 2024-07-05 DIAGNOSIS — H35033 Hypertensive retinopathy, bilateral: Secondary | ICD-10-CM

## 2024-07-05 DIAGNOSIS — D3132 Benign neoplasm of left choroid: Secondary | ICD-10-CM

## 2024-08-08 ENCOUNTER — Encounter (INDEPENDENT_AMBULATORY_CARE_PROVIDER_SITE_OTHER): Admitting: Ophthalmology

## 2024-08-08 DIAGNOSIS — D3132 Benign neoplasm of left choroid: Secondary | ICD-10-CM | POA: Diagnosis not present

## 2024-08-08 DIAGNOSIS — H34832 Tributary (branch) retinal vein occlusion, left eye, with macular edema: Secondary | ICD-10-CM | POA: Diagnosis not present

## 2024-08-08 DIAGNOSIS — I10 Essential (primary) hypertension: Secondary | ICD-10-CM

## 2024-08-08 DIAGNOSIS — H35033 Hypertensive retinopathy, bilateral: Secondary | ICD-10-CM

## 2024-08-08 DIAGNOSIS — H348312 Tributary (branch) retinal vein occlusion, right eye, stable: Secondary | ICD-10-CM

## 2024-08-08 DIAGNOSIS — H43813 Vitreous degeneration, bilateral: Secondary | ICD-10-CM

## 2024-09-12 ENCOUNTER — Encounter (INDEPENDENT_AMBULATORY_CARE_PROVIDER_SITE_OTHER): Admitting: Ophthalmology

## 2024-09-12 DIAGNOSIS — I10 Essential (primary) hypertension: Secondary | ICD-10-CM

## 2024-09-12 DIAGNOSIS — H43811 Vitreous degeneration, right eye: Secondary | ICD-10-CM

## 2024-09-12 DIAGNOSIS — H35033 Hypertensive retinopathy, bilateral: Secondary | ICD-10-CM

## 2024-09-12 DIAGNOSIS — H348312 Tributary (branch) retinal vein occlusion, right eye, stable: Secondary | ICD-10-CM | POA: Diagnosis not present

## 2024-09-12 DIAGNOSIS — H34832 Tributary (branch) retinal vein occlusion, left eye, with macular edema: Secondary | ICD-10-CM

## 2024-09-12 DIAGNOSIS — D3132 Benign neoplasm of left choroid: Secondary | ICD-10-CM | POA: Diagnosis not present

## 2024-10-24 ENCOUNTER — Encounter (INDEPENDENT_AMBULATORY_CARE_PROVIDER_SITE_OTHER): Admitting: Ophthalmology
